# Patient Record
Sex: Female | Born: 1990 | Race: White | Hispanic: No | Marital: Married | Smoking: Never smoker
Health system: Southern US, Community
[De-identification: ages and names within clinical notes are randomized; demographics above are authoritative.]

## PROBLEM LIST (undated history)

## (undated) DIAGNOSIS — Z789 Other specified health status: Secondary | ICD-10-CM

## (undated) HISTORY — DX: Other specified health status: Z78.9

## (undated) HISTORY — PX: TUBAL LIGATION: SHX77

## (undated) HISTORY — PX: NASAL SEPTUM SURGERY: SHX37

---
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
## 2011-01-18 ENCOUNTER — Emergency Department: Payer: Self-pay | Admitting: Emergency Medicine

## 2011-01-21 ENCOUNTER — Other Ambulatory Visit: Payer: Self-pay | Admitting: Emergency Medicine

## 2011-02-01 ENCOUNTER — Emergency Department: Payer: Self-pay | Admitting: Emergency Medicine

## 2011-12-09 IMAGING — US US PELV - US TRANSVAGINAL
1 series · 17 of 25 positions shown · non-contrast
Comparison: none

REASON FOR EXAM: rule out products of conception, heavy vaginal bleeding
COMMENTS:

[Series 1: us pelv - us transvaginal · 17 of 72 slices shown]
[im 1/72]
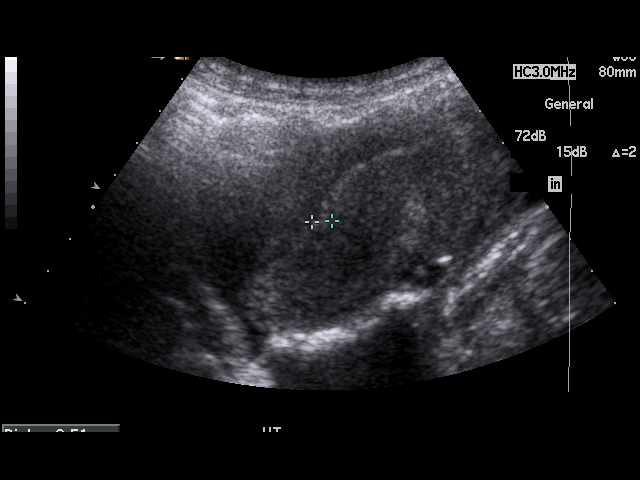
[im 6/72]
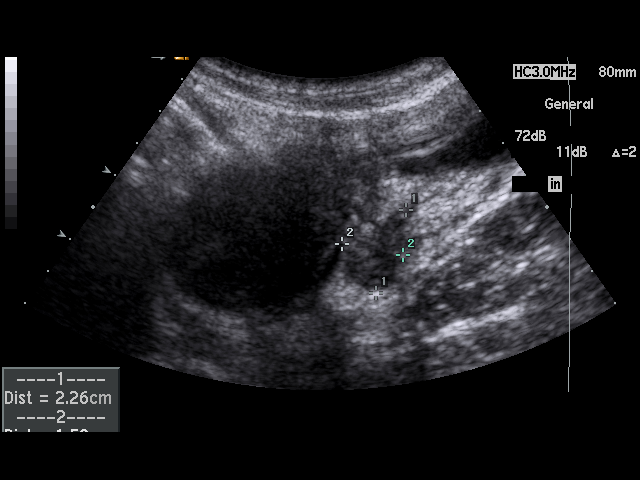
[im 9/72]
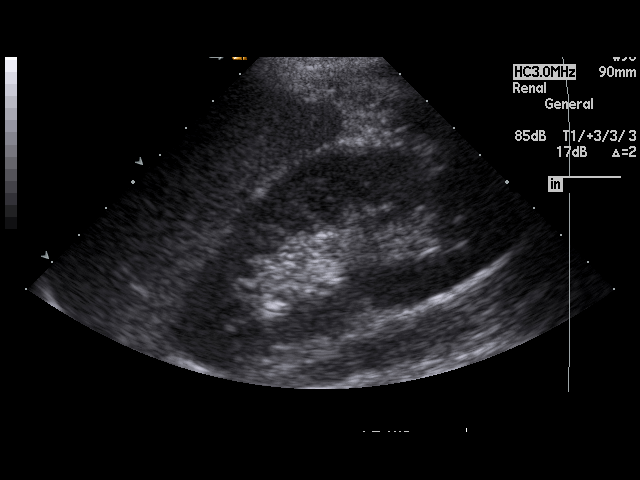
[im 15/72]
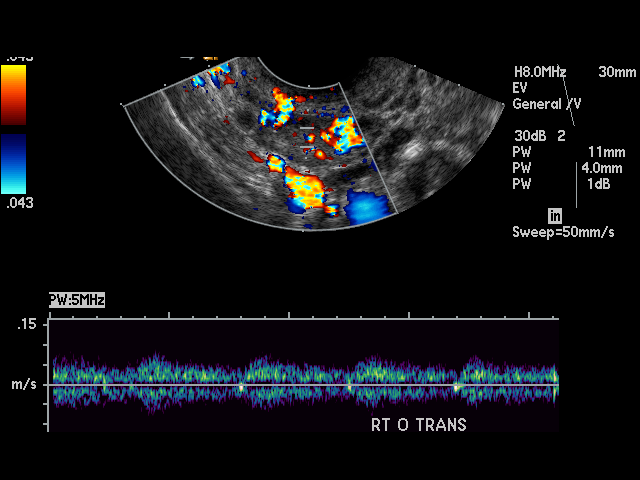
[im 18/72]
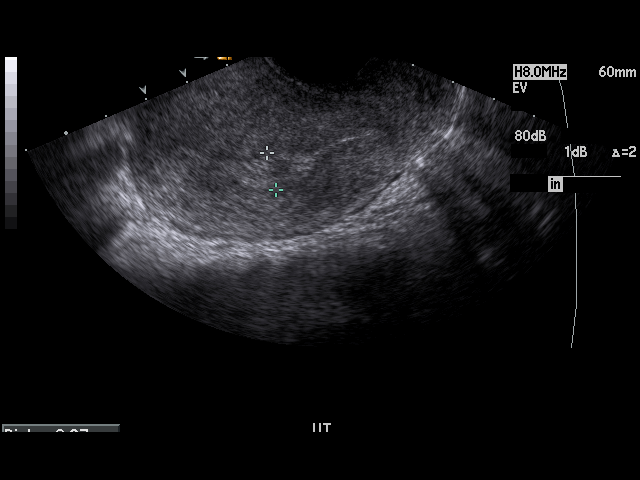
[im 24/72]
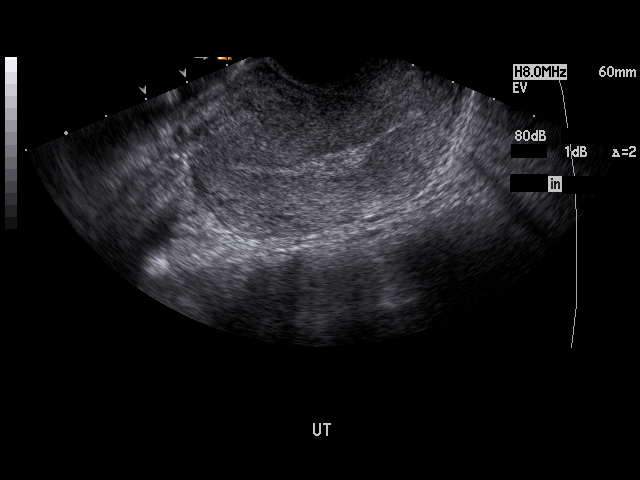
[im 27/72]
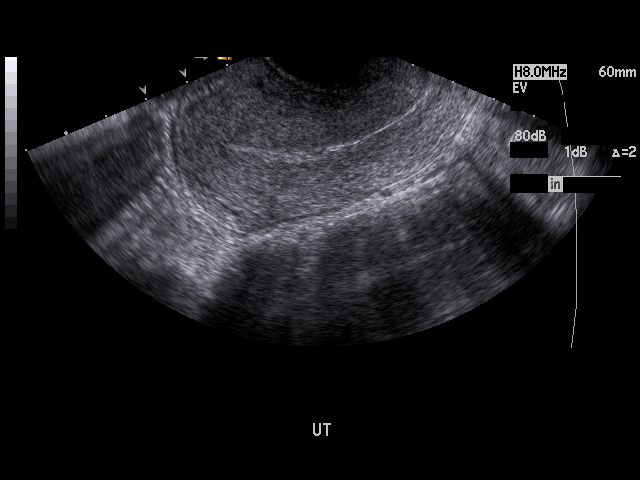
[im 33/72]
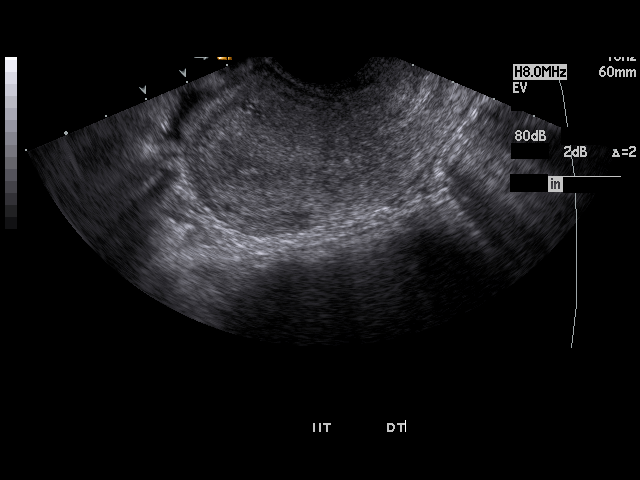
[im 36/72]
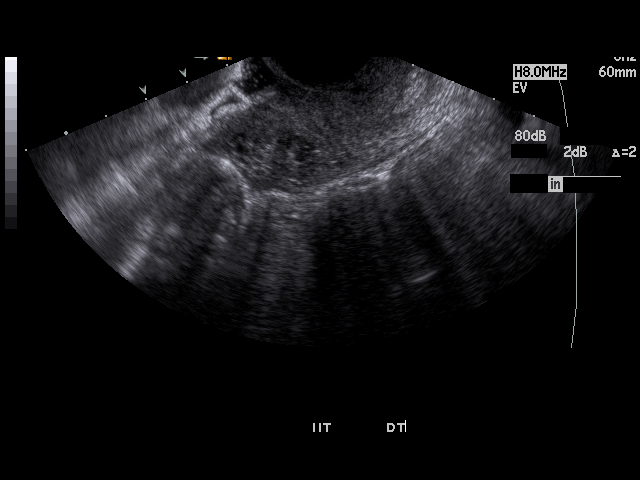
[im 39/72]
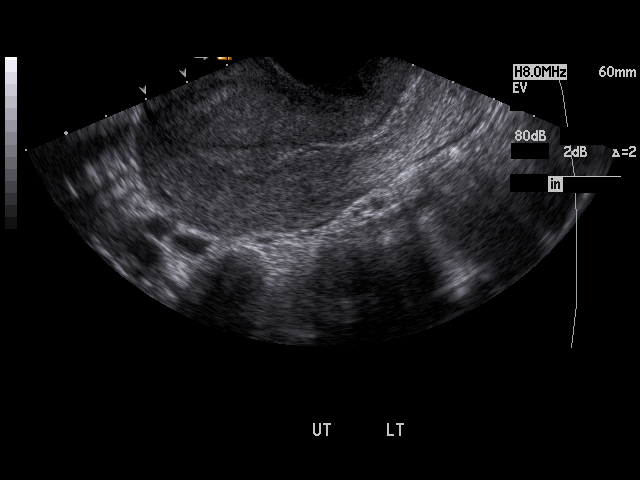
[im 45/72]
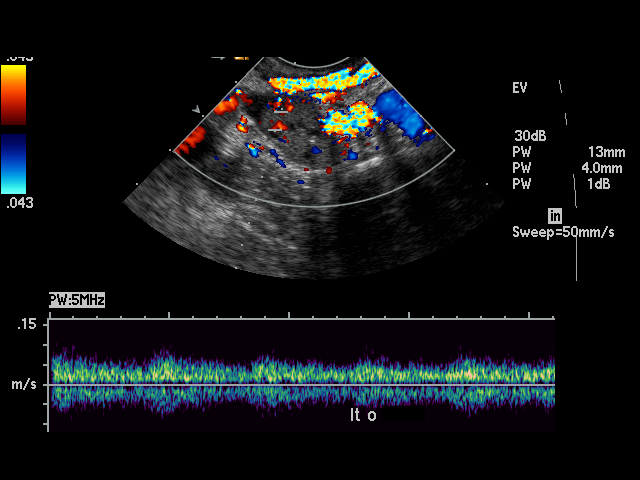
[im 48/72]
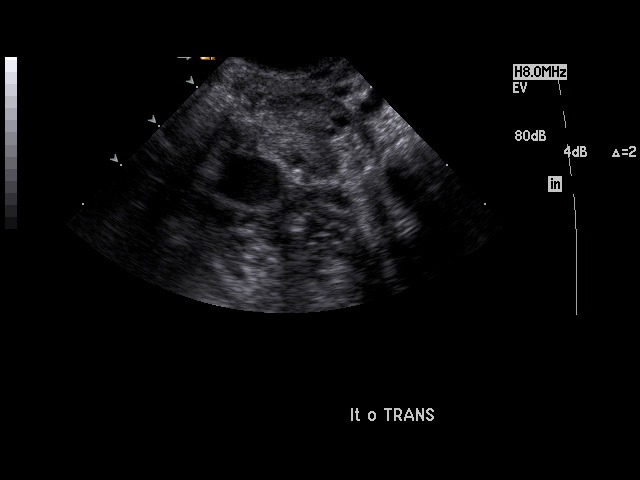
[im 54/72]
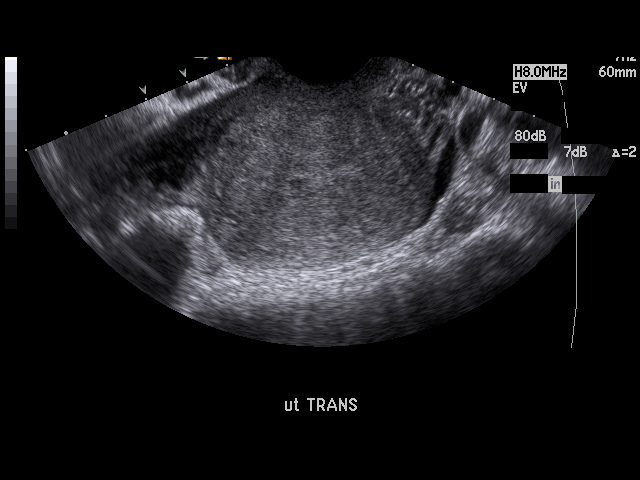
[im 57/72]
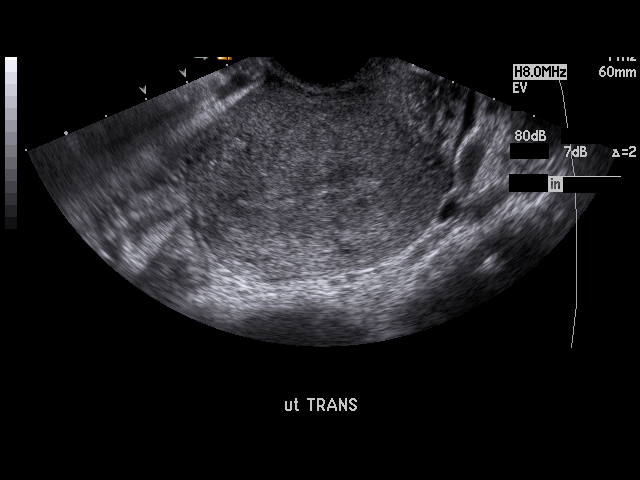
[im 63/72]
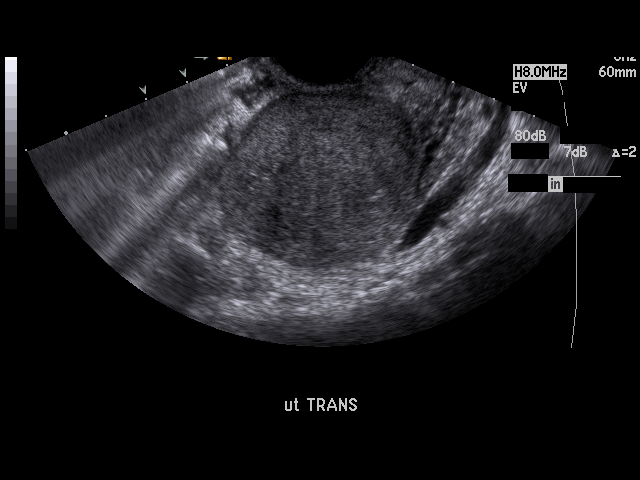
[im 66/72]
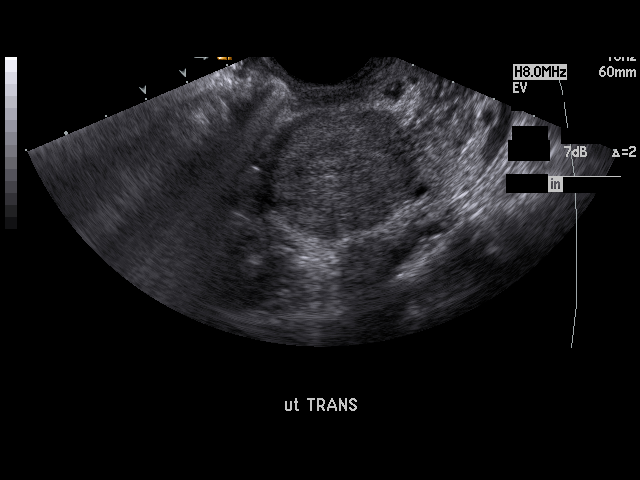
[im 72/72]
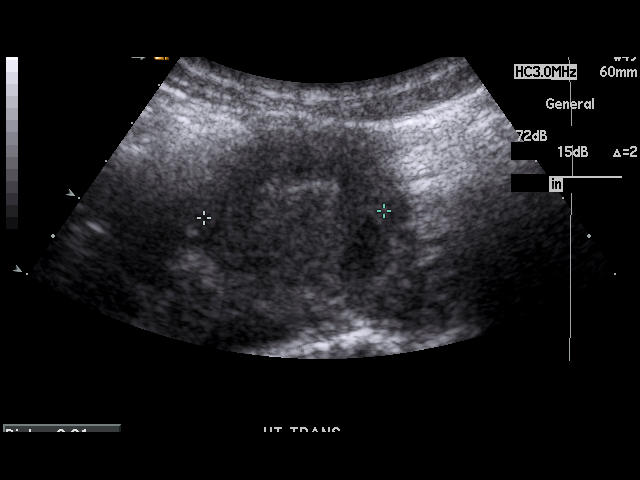

[17 of 25 positions shown; findings below may reference images not displayed]

PROCEDURE:     US  - US PELVIS EXAM W/TRANSVAGINAL  - February 01, 2011  [DATE]

RESULT:     The patient was actively bleeding during the course of the
study. The uterus measures 8.1 x 4.5 x 3.9 cm. The endometrial stripe is
thickened and there is increased echogenicity within the endometrial cavity
which was seen to decrease over the course of the study. The ovaries are
normal in echotexture and contour. No cystic or solid adnexal masses were
demonstrated. There is no free fluid in the cul-de-sac.
IMPRESSION: The patient is actively bleeding during the study, and it
was evident that clotted blood was being expelled from the endometrial
cavity. No discrete endometrial masses were identified. No adnexal masses
are demonstrated.

## 2012-08-17 ENCOUNTER — Emergency Department: Payer: Self-pay | Admitting: Emergency Medicine

## 2013-01-07 ENCOUNTER — Observation Stay: Payer: Self-pay | Admitting: Obstetrics and Gynecology

## 2013-01-07 LAB — URINALYSIS, COMPLETE
Bacteria: NONE SEEN
Bilirubin,UR: NEGATIVE
Blood: NEGATIVE
Glucose,UR: NEGATIVE mg/dL
Ketone: NEGATIVE
Leukocyte Esterase: NEGATIVE
Nitrite: NEGATIVE
Ph: 6
Protein: NEGATIVE
RBC,UR: 1 /HPF
Specific Gravity: 1.024
Squamous Epithelial: 1
WBC UR: 2 /HPF

## 2013-02-26 ENCOUNTER — Observation Stay: Payer: Self-pay | Admitting: Obstetrics & Gynecology

## 2013-05-12 ENCOUNTER — Observation Stay: Payer: Self-pay

## 2013-05-18 ENCOUNTER — Inpatient Hospital Stay: Payer: Self-pay | Admitting: Obstetrics and Gynecology

## 2013-05-18 LAB — CBC WITH DIFFERENTIAL/PLATELET
Eosinophil #: 0 10*3/uL (ref 0.0–0.7)
Eosinophil %: 0.5 %
HCT: 33.3 % — ABNORMAL LOW (ref 35.0–47.0)
Lymphocyte #: 1.5 10*3/uL (ref 1.0–3.6)
Lymphocyte %: 17.6 %
MCV: 76 fL — ABNORMAL LOW (ref 80–100)
Monocyte #: 0.5 x10 3/mm (ref 0.2–0.9)
Monocyte %: 6.6 %
Neutrophil #: 6.2 10*3/uL (ref 1.4–6.5)
Platelet: 224 10*3/uL (ref 150–440)
RBC: 4.36 10*6/uL (ref 3.80–5.20)
RDW: 15.3 % — ABNORMAL HIGH (ref 11.5–14.5)
WBC: 8.3 10*3/uL (ref 3.6–11.0)

## 2013-05-18 LAB — GC/CHLAMYDIA PROBE AMP

## 2013-10-08 ENCOUNTER — Emergency Department: Payer: Self-pay | Admitting: Emergency Medicine

## 2013-10-08 LAB — URINALYSIS, COMPLETE
Bilirubin,UR: NEGATIVE
Glucose,UR: NEGATIVE mg/dL (ref 0–75)
Nitrite: NEGATIVE
Ph: 5 (ref 4.5–8.0)
Protein: NEGATIVE
RBC,UR: 1 /HPF (ref 0–5)
Specific Gravity: 1.023 (ref 1.003–1.030)
WBC UR: 27 /HPF (ref 0–5)

## 2013-10-08 LAB — CBC
HCT: 44.7 % (ref 35.0–47.0)
MCH: 29.3 pg (ref 26.0–34.0)
MCHC: 33.7 g/dL (ref 32.0–36.0)
Platelet: 222 10*3/uL (ref 150–440)
RBC: 5.15 10*6/uL (ref 3.80–5.20)
RDW: 13.9 % (ref 11.5–14.5)

## 2013-10-08 LAB — COMPREHENSIVE METABOLIC PANEL
Anion Gap: 6 — ABNORMAL LOW (ref 7–16)
BUN: 9 mg/dL (ref 7–18)
Calcium, Total: 9.4 mg/dL (ref 8.5–10.1)
Chloride: 105 mmol/L (ref 98–107)
Co2: 24 mmol/L (ref 21–32)
Glucose: 95 mg/dL (ref 65–99)
SGOT(AST): 22 U/L (ref 15–37)
Sodium: 135 mmol/L — ABNORMAL LOW (ref 136–145)
Total Protein: 8 g/dL (ref 6.4–8.2)

## 2013-10-10 LAB — URINE CULTURE

## 2014-01-17 ENCOUNTER — Observation Stay: Payer: Self-pay | Admitting: Obstetrics and Gynecology

## 2014-02-09 ENCOUNTER — Observation Stay: Payer: Self-pay

## 2014-02-09 LAB — URINALYSIS, COMPLETE
Bilirubin,UR: NEGATIVE
Glucose,UR: NEGATIVE mg/dL (ref 0–75)
Nitrite: NEGATIVE
PROTEIN: NEGATIVE
Ph: 5 (ref 4.5–8.0)
SPECIFIC GRAVITY: 1.028 (ref 1.003–1.030)

## 2014-02-09 LAB — WET PREP, GENITAL

## 2014-03-24 ENCOUNTER — Observation Stay: Payer: Self-pay | Admitting: Obstetrics and Gynecology

## 2014-03-24 LAB — URINALYSIS, COMPLETE
Bilirubin,UR: NEGATIVE
Blood: NEGATIVE
GLUCOSE, UR: NEGATIVE mg/dL (ref 0–75)
KETONE: NEGATIVE
Nitrite: NEGATIVE
PH: 6 (ref 4.5–8.0)
Protein: NEGATIVE
RBC,UR: 3 /HPF (ref 0–5)
SPECIFIC GRAVITY: 1.013 (ref 1.003–1.030)
Squamous Epithelial: 3

## 2014-04-13 ENCOUNTER — Ambulatory Visit: Payer: Self-pay | Admitting: Obstetrics and Gynecology

## 2014-04-13 LAB — CBC WITH DIFFERENTIAL/PLATELET
BASOS PCT: 0.3 %
Basophil #: 0 10*3/uL (ref 0.0–0.1)
Eosinophil #: 0 10*3/uL (ref 0.0–0.7)
Eosinophil %: 0.4 %
HCT: 34.3 % — ABNORMAL LOW (ref 35.0–47.0)
HGB: 10.5 g/dL — ABNORMAL LOW (ref 12.0–16.0)
Lymphocyte #: 1.1 10*3/uL (ref 1.0–3.6)
Lymphocyte %: 14 %
MCH: 23.1 pg — ABNORMAL LOW (ref 26.0–34.0)
MCHC: 30.6 g/dL — ABNORMAL LOW (ref 32.0–36.0)
MCV: 76 fL — ABNORMAL LOW (ref 80–100)
Monocyte #: 0.5 x10 3/mm (ref 0.2–0.9)
Monocyte %: 6.8 %
Neutrophil #: 5.9 10*3/uL (ref 1.4–6.5)
Neutrophil %: 78.5 %
PLATELETS: 243 10*3/uL (ref 150–440)
RBC: 4.54 10*6/uL (ref 3.80–5.20)
RDW: 16.9 % — AB (ref 11.5–14.5)
WBC: 7.5 10*3/uL (ref 3.6–11.0)

## 2014-04-14 ENCOUNTER — Inpatient Hospital Stay: Payer: Self-pay

## 2014-04-14 LAB — GC/CHLAMYDIA PROBE AMP

## 2014-04-15 LAB — PATHOLOGY REPORT

## 2015-02-11 NOTE — Op Note (Signed)
PATIENT NAME:  Alicia JamesSTANFORD, Nuriyah N MR#:  [id number] DATE OF BIRTH:  Jul 31, 1991  DATE OF PROCEDURE:  04/14/2014  PREOPERATIVE DIAGNOSIS: Breech presentation.  POSTOPERATIVE DIAGNOSIS: Breech presentation, multiparous female desiring permanent sterilization.   PROCEDURE: Primary lower uterine transverse Cesarean section with postpartum bilateral tubal ligation.   SURGEON: Adria Devonarrie Annaliyah Willig, M.D.   ASSISTANT: Thomasene MohairStephen Jackson, MD  ESTIMATED BLOOD LOSS: 1000 mL.   FINDINGS: Term liveborn female infant, normal uterus, tubes and ovaries with complete transection of the tubes.   DESCRIPTION OF PROCEDURE: The patient was taken to the operating room and placed in supine position. After adequate general endotracheal anesthesia was instilled, the patient was prepped and draped in the usual sterile fashion. Timeout was performed. A Pfannenstiel skin incision was made approximately 2 fingerbreadths above the pubic symphysis and carried sharply down to the fascia. Fascia was nicked in the midline. The incision was extended in a superolateral manner. Muscle bellies were sharply and bluntly dissected off the rectus fascia. The muscle belly midline was identified and opened. The peritoneum was grasped, cut and opened. A bladder blade was placed. A bladder flap was created. Uterine incision was made. The infant's butt was delivered up to the feet. The knees were bent. The nuchal arms, first to the right then the left, were removed by rotating the infant and the infant's head was delivered. The infant's cord was clamped and cut and the infant was handed to the awaiting pediatrician. The uterus was exteriorized and wrapped in a moist laparotomy sponge. The interior of the uterus was curetted with a moist lap sponge. Uterine incision was closed with a running locked chromic and then a running imbricating chromic suture. Then 3-0 chromic was used to tack the bladder flap back up. The belly was cleared of clots. The gutters  were cleared. The uterus was placed back into the abdomen. The muscle bellies were approximated with a 0 Vicryl suture. The On-Q trocars were placed. The catheters were primed. The fascia was closed with a running Vicryl suture. The catheters were underneath the fascia but above the muscle. A 4-0 Monocryl was used to close the skin edges. Dermabond was placed on the On-Q catheters with a 4 x 4, and the uterine incision was closed with Steri-Strips and benzoin. Bandage was placed. Clear urine was noted in the Foley bag and the patient was taken to recovery after having tolerated the procedure well.  ____________________________ Elliot Gurneyarrie C. Krizia Flight, MD cck:sb D: 04/21/2014 01:10:10 ET T: 04/21/2014 10:53:08 ET JOB#: [id number]  cc: Elliot Gurneyarrie C. Katricia Prehn, MD, <Dictator> Elliot GurneyARRIE C Matson Welch MD ELECTRONICALLY SIGNED 04/26/2014 15:56

## 2015-02-28 NOTE — H&P (Signed)
L&D Evaluation:  History:  HPI 24 yo [id number] [facility] presenting with contractions and possible LOF on the way to work today.  Noted small amount of leakage at [employer], none since.  Also noted about five contraction in an hour, none since presentation.  +FM, no LOF, no VB.  History of oligohydramnios last pregnancy,  No history of PTB.   Presents with contractions, leaking fluid   Patient's Medical History No Chronic Illness   Patient's Surgical History none   Medications Pre Natal Vitamins   Allergies augmentin - throat swelling   Social History none   Family History Non-Contributory   ROS:  ROS All systems were reviewed.  HEENT, CNS, GI, GU, Respiratory, CV, Renal and Musculoskeletal systems were found to be normal.   Exam:  Vital Signs stable   Urine Protein not completed   General no apparent distress   Mental Status clear   Chest no increased work of breathing   Abdomen gravid, non-tender   Estimated Fetal Weight Average for gestational age   Pelvic no external lesions, visually closed   Mebranes Intact, negative ferning, nitrazine, and pooling   FHT 145, moderate, +accels, no decels reactive by <32 week criteria   Ucx absent   Impression:  Impression R/O ROM and R/O PTL   Plan:  Comments 1) R/O ROM  2) R/O PTL  3) Fetus - category I tracing  4) PNL - (O positive / ABSC neg / RI / VZI / HBsAg neg / HIV neg / RPR NR from prior pregnancy)  5) Disposition -discharge home has follow up for 28 week labs in 2 weeks   Electronic Signatures: Lorrene ReidStaebler, Karilyn Wind M (MD)  (Signed 30-Mar-15 17:10)  Authored: L&D Evaluation   Last Updated: 30-Mar-15 17:10 by Lorrene ReidStaebler, Maryln Eastham M (MD)

## 2015-02-28 NOTE — H&P (Signed)
L&D Evaluation:  History:  HPI 24 year old G3 P1011 with EDC=05/23/2013 by a 17wk4day ultrasound presents with c/o decreased FM. Felt baby move once today this AM. Last ate at 1430. No VB or LOF. Denied contractions. [facility] for a normal anatomy scan and low BMI (was 97# at start of pregnancy). Patient has gained 29# with pregnancy.   Presents with decreased fetal movement   Patient's Medical History No Chronic Illness   Patient's Surgical History none   Medications Pre Natal Vitamins   Allergies Augmentin   Social History none   Family History Non-Contributory   ROS:  ROS see HPI   Exam:  Vital Signs stable  98/64   Urine Protein not completed   General no apparent distress   Mental Status clear   Abdomen gravid, non-tender   Fetal Position vtx   Pelvic no external lesions, 1/60%/-1 to 0   Mebranes Intact, AFI=8.3cm with two pockets>3 cm   FHT 120 with multiple accels to 160s to 170s   FHT Description moderate variability   Ucx q3-7 min apart, mild   Skin dry   Impression:  Impression IUP at 38 3/7 weeks with reactive NST   Plan:  [facility]. Labor precautions.   Comments Advised to increase water intake.   Electronic Signatures: Trinna BalloonGutierrez, Elianis Fischbach L (CNM)  (Signed 23-Jul-14 23:59)  Authored: L&D Evaluation   Last Updated: 23-Jul-14 23:59 by Trinna BalloonGutierrez, Araseli Sherry L (CNM)

## 2015-02-28 NOTE — H&P (Signed)
L&D Evaluation:  History Expanded:  HPI 24 yo G4 P2012, EDD of 04/19/14 per 12 wk US, presents at 30w 1d with c/o gush of fluid last night and today. Denies regular ctx, VB or decreased FM. [facility] to [facility].   Blood Type (Maternal) O positive   Patient's Medical History No Chronic Illness   Patient's Surgical History none   Allergies Augmentin   Social History none   Exam:  Vital Signs stable   General no apparent distress   Mental Status clear   Abdomen gravid, non-tender   Edema no edema   Pelvic no external lesions, 1/50/high   Mebranes Intact   FHT normal rate with no decels, category 1 tracing - baseline 135, + accelerations, no decels   Ucx irregular ctx per toco, pt denies feeling them   Other Wet mount: + clue per lab (none seen on my exam) Pooling, Fern & nitrizine negative UA: tr ketones, 2+ leuk, 1+ blood, trace bacteria   Impression:  Impression IUP at 30 wks, intact membranes   Plan:  Comments Pt concerned about gushes she experienced as similar symptoms occured with last pregnancy prior to being diagnosed as Oligohydramnios. Given option to wait for US for AFI here (Ultrasound dept currently at high volume) or we can get US at our office - pt opted for office. Will set up in 1-2 days.   Recommended pelvic rest x 1 week d/t dilation despite not feeling contractions. Also advised to increase po hydration.   Electronic Signatures: Daemion Mcniel, Marta Lamasamara K (CNM)  (Signed 23-Apr-15 00:10)  Authored: L&D Evaluation   Last Updated: 23-Apr-15 00:10 by Vella KohlerBrothers, Jessicamarie Amiri K (CNM)

## 2015-02-28 NOTE — H&P (Signed)
L&D Evaluation:  History Expanded:  HPI 24 year old G3 P1011 with EDC=05/23/2013 by a 17wk4day ultrasound. Presents from office for IOL for AFI of 4.6 cm. EFW today was 19% (6#9oz). No VB or LOF. Denies painful contractions. [facility] normal anatomy scan and low BMI (was 97# at start of pregnancy).   Blood Type (Maternal) O positive   Group B Strep Results Maternal (Result >5wks must be treated as unknown) negative   Maternal HIV Negative   Maternal Syphilis Ab Nonreactive   Maternal Varicella Immune   Rubella Results (Maternal) immune   Maternal T-Dap Immune   Presents with Oligohydramnios   Patient's Medical History No Chronic Illness   Patient's Surgical History none   Medications Pre Natal Vitamins   Allergies PCN, Augmentin - anaphylaxis   Social History none   Family History Non-Contributory   ROS:  ROS see HPI   Exam:  Vital Signs stable   Urine Protein not completed   General no apparent distress   Mental Status clear   Chest clear   Heart no murmur/gallop/rubs   Abdomen gravid, non-tender   Estimated Fetal Weight EFW today 19%   Fetal Position vtx   Pelvic no external lesions, 1/75/0 to 1+   Mebranes Intact, fern, nitrizine & pooling negative   FHT normal rate with no decels, Category 1 - baseline 130, mod variability, +accelerations   Ucx irregular   Skin dry   Impression:  Impression IUP at 39 2/7 wks, Oligohydramnios   Plan:  Comments Discussed IOL with pt and s/o. Will plan for cervidil as cervix is unfavorable. Aware of risks including FITL, Oligo.   Electronic Signatures: Vella KohlerBrothers, Daylyn Azbill K (CNM)  (Signed 29-Jul-14 13:51)  Authored: L&D Evaluation   Last Updated: 29-Jul-14 13:51 by Vella KohlerBrothers, Marselino Slayton K (CNM)

## 2015-02-28 NOTE — H&P (Signed)
L&D Evaluation:  History Expanded:  HPI 24 yo G3P10011 at 5720 w 3 days who presnts for abdominal pain possible contractions. pt is very slight and she feels the movement of the baby, she is Opos and we have no other records even highlighted [facility]   [place]Gravida 3   Term 1   PreTerm 0   Abortion 1   Living 1   Blood Type (Maternal) O positive   Group B Strep Results Maternal (Result >5wks must be treated as unknown) unknown/result > 5 weeks ago   Maternal HIV Negative   Maternal Varicella Unknown   Rubella Results (Maternal) unknown   Maternal T-Dap Unknown   Presents with abdominal pain, contractions   Patient's Medical History No Chronic Illness   Patient's Surgical History none   Medications Pre Natal Vitamins   Allergies NKDA   Social History none  tobacco  second hand   Family History Non-Contributory   ROS:  ROS All systems were reviewed.  HEENT, CNS, GI, GU, Respiratory, CV, Renal and Musculoskeletal systems were found to be normal.   Exam:  Vital Signs stable   Urine Protein negative dipstick   General no apparent distress   Mental Status clear   Chest clear   Heart normal sinus rhythm   Abdomen gravid, non-tender   Estimated Fetal Weight Average for gestational age   Fundal Height umbilicus   Back no CVAT   Edema no edema   Reflexes 1+   Pelvic no external lesions   Mebranes Intact   FHT normal rate with no decels, flat tracing heart beat heard, 20 weeks only   Ucx absent   Skin dry   Impression:  Impression round ligament pain   Plan:  Plan UA, EFM/NST, monitor contractions and for cervical change   Comments cervix is VERY posterior thick long and firm and closed   Follow Up Appointment already scheduled   Electronic Signatures: Adria DevonKlett, Shaquanna Lycan (MD)  (Signed 20-Mar-14 20:38)  Authored: L&D Evaluation   Last Updated: 20-Mar-14 20:38 by Adria DevonKlett, Delayni Streed (MD)

## 2016-03-04 ENCOUNTER — Encounter: Payer: Self-pay | Admitting: Emergency Medicine

## 2016-03-04 ENCOUNTER — Emergency Department: Payer: Medicaid Other

## 2016-03-04 ENCOUNTER — Emergency Department
Admission: EM | Admit: 2016-03-04 | Discharge: 2016-03-04 | Disposition: A | Discharge status: Home / Self Care | Payer: Medicaid Other | Attending: Emergency Medicine | Admitting: Emergency Medicine

## 2016-03-04 DIAGNOSIS — G44309 Post-traumatic headache, unspecified, not intractable: Secondary | ICD-10-CM | POA: Diagnosis not present

## 2016-03-04 DIAGNOSIS — Y92410 Unspecified street and highway as the place of occurrence of the external cause: Secondary | ICD-10-CM | POA: Insufficient documentation

## 2016-03-04 DIAGNOSIS — R42 Dizziness and giddiness: Secondary | ICD-10-CM | POA: Diagnosis present

## 2016-03-04 DIAGNOSIS — Y939 Activity, unspecified: Secondary | ICD-10-CM | POA: Insufficient documentation

## 2016-03-04 DIAGNOSIS — Y999 Unspecified external cause status: Secondary | ICD-10-CM | POA: Diagnosis not present

## 2016-03-04 DIAGNOSIS — F0781 Postconcussional syndrome: Secondary | ICD-10-CM | POA: Diagnosis not present

## 2016-03-04 MED ORDER — BUTALBITAL-APAP-CAFFEINE 50-325-40 MG PO TABS: 1.0000 | ORAL_TABLET | Freq: Four times a day (QID) | ORAL | Status: AC | PRN
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
## 2016-03-04 NOTE — Discharge Instructions (Signed)
Post-Concussion Syndrome Post-concussion syndrome is the symptoms that can occur after a head injury. These symptoms can last from weeks to months. HOME CARE   Take medicines only as told by your doctor.  Do not take aspirin.  Sleep with your head raised to help with headaches.  Avoid activities that can cause another head injury.  Do not play contact sports like football, hockey, soccer, or basketball.  Do not do other risky activities like downhill skiing, martial arts, or horseback riding until your doctor says it is okay.  Keep all follow-up visits as told by your doctor. This is important.   GET HELP IF:   You have a harder time:  Paying attention.  Focusing.  Remembering.  Learning new information.  Dealing with stress.  You need more time to complete tasks.  You are easily bothered (irritable).  You have more symptoms.   Get help if you have any of these symptoms for more than two weeks after your injury:   Long-lasting (chronic) headaches.  Dizziness.  Trouble balancing.  Feeling sick to your stomach (nauseous).  Trouble with your vision.  Noise or light bothers you more.  Depression.  Mood swings.  Feeling worried (anxious).  Easily bothered.  Memory problems.  Trouble concentrating or paying attention.  Sleep problems.  Feeling tired all of the time.   GET HELP RIGHT AWAY IF:  You feel confused.  You feel very sleepy.  You are hard to wake up.  You feel sick to your stomach.  You keep throwing up (vomiting).  You feel like you are moving when you are not (vertigo).  Your eyes move back and forth very quickly.  You start shaking (convulsing) or pass out (faint).  You have very bad headaches that do not get better with medicine.  You cannot use your arms or legs like normal.  One of the black centers of your eyes (pupils) is bigger than the other.  You have clear or bloody fluid coming from your nose or ears.  Your  problems get worse, not better. MAKE SURE YOU:  Understand these instructions.  Will watch your condition.  Will get help right away if you are not doing well or get worse.   This information is not intended to replace advice given to you by your health care provider. Make sure you discuss any questions you have with your health care provider.   Document Released: 11/14/2004 Document Revised: 10/28/2014 Document Reviewed: 01/12/2014 Elsevier Interactive Patient Education 2016 [employer]  

## 2016-11-09 ENCOUNTER — Emergency Department
Admission: EM | Admit: 2016-11-09 | Discharge: 2016-11-09 | Disposition: A | Discharge status: Home / Self Care | Payer: Managed Care, Other (non HMO) | Attending: Emergency Medicine | Admitting: Emergency Medicine

## 2016-11-09 ENCOUNTER — Encounter: Payer: Self-pay | Admitting: Emergency Medicine

## 2016-11-09 DIAGNOSIS — Z79899 Other long term (current) drug therapy: Secondary | ICD-10-CM | POA: Diagnosis not present

## 2016-11-09 DIAGNOSIS — K0889 Other specified disorders of teeth and supporting structures: Secondary | ICD-10-CM | POA: Insufficient documentation

## 2016-11-09 MED ORDER — LIDOCAINE VISCOUS 2 % MT SOLN
15.0000 mL | Freq: Once | OROMUCOSAL | Status: AC
  Administered 2016-11-09: 15 mL via OROMUCOSAL
  Filled 2016-11-09: qty 15

## 2016-11-09 MED ORDER — CLINDAMYCIN HCL 300 MG PO CAPS: 300.0000 mg | ORAL_CAPSULE | Freq: Three times a day (TID) | ORAL | 0 refills | Status: DC

## 2016-11-09 MED ORDER — OXYCODONE-ACETAMINOPHEN 5-325 MG PO TABS: 1.0000 | ORAL_TABLET | ORAL | 0 refills | Status: DC | PRN

## 2016-11-09 MED ORDER — IBUPROFEN 600 MG PO TABS
600.0000 mg | ORAL_TABLET | Freq: Once | ORAL | Status: AC
  Administered 2016-11-09: 600 mg via ORAL
  Filled 2016-11-09: qty 1

## 2016-11-09 MED ORDER — CLINDAMYCIN HCL 150 MG PO CAPS
300.0000 mg | ORAL_CAPSULE | Freq: Once | ORAL | Status: AC
  Administered 2016-11-09: 300 mg via ORAL
  Filled 2016-11-09: qty 2

## 2016-11-09 MED ORDER — OXYCODONE-ACETAMINOPHEN 5-325 MG PO TABS
1.0000 | ORAL_TABLET | Freq: Once | ORAL | Status: AC
  Administered 2016-11-09: 1 via ORAL
  Filled 2016-11-09: qty 1

## 2016-11-09 MED ORDER — IBUPROFEN 600 MG PO TABS: 600.0000 mg | ORAL_TABLET | Freq: Three times a day (TID) | ORAL | 0 refills | Status: AC | PRN

## 2016-11-09 NOTE — ED Provider Notes (Signed)
[facility] Emergency Department Provider Note   ____________________________________________   First MD Initiated Contact with Patient 11/09/16 [phone]     (approximate)  I have reviewed the triage vital signs and the nursing notes.   HISTORY  Chief Complaint Dental Pain    HPI Alicia Jacobson is a 26 y.o. female presents to the ED from home with a chief complaint of dental pain. Patient reports right lower wisdom tooth pain for the past several months; worsening times one to 2 days. States she has a wisdom tooth coming in which seems to have broken her adjacent molar. Denies associated fever, chills, chest, shortness of breath, abdominal pain, nausea, vomiting, diarrhea. Denies recent travel trauma. Nothing makes her symptoms better or worse.   Past medical history None  There are no active problems to display for this patient.   Past Surgical History:  Procedure Laterality Date  . NASAL SEPTUM SURGERY    . TUBAL LIGATION      Prior to Admission medications   Medication Sig Start Date End Date Taking? Authorizing Provider  butalbital-acetaminophen-caffeine (FIORICET) 50-325-40 MG tablet Take 1-2 tablets by mouth every 6 (six) hours as needed for headache. 03/04/16 03/04/17  Chinita Pester, FNP  clindamycin (CLEOCIN) 300 MG capsule Take 1 capsule (300 mg total) by mouth 3 (three) times daily. 11/09/16   Irean Hong, MD  ibuprofen (ADVIL,MOTRIN) 600 MG tablet Take 1 tablet (600 mg total) by mouth every 8 (eight) hours as needed. 11/09/16   Irean Hong, MD  oxyCODONE-acetaminophen (ROXICET) 5-325 MG tablet Take 1 tablet by mouth every 4 (four) hours as needed for severe pain. 11/09/16   Irean Hong, MD    Allergies Amoxicillin and Augmentin [amoxicillin-pot clavulanate]  No family history on file.  Social History Social History  Substance Use Topics  . Smoking status: Never Smoker  . Smokeless tobacco: Never Used  . Alcohol use Yes     Review of Systems  Constitutional: No fever/chills. Eyes: No visual changes. ENT: Positive for dentalgia. No sore throat. Cardiovascular: Denies chest pain. Respiratory: Denies shortness of breath. Gastrointestinal: No abdominal pain.  No nausea, no vomiting.  No diarrhea.  No constipation. Genitourinary: Negative for dysuria. Musculoskeletal: Negative for back pain. Skin: Negative for rash. Neurological: Negative for headaches, focal weakness or numbness.  10-point ROS otherwise negative.  ____________________________________________   PHYSICAL EXAM:  VITAL SIGNS: ED Triage Vitals  Enc Vitals Group     BP 11/09/16 0013 114/60     Pulse Rate 11/09/16 0013 88     Resp 11/09/16 0013 16     Temp 11/09/16 0013 98.3 F (36.8 C)     Temp Source 11/09/16 0013 Oral     SpO2 11/09/16 0013 98 %     Weight 11/09/16 0015 100 lb (45.4 kg)     Height 11/09/16 0015 5' (1.524 m)     Head Circumference --      Peak Flow --      Pain Score 11/09/16 0015 10     Pain Loc --      Pain Edu? --      Excl. in GC? --     Constitutional: Alert and oriented. Well appearing and in no acute distress. Eyes: Conjunctivae are normal. PERRL. EOMI. Head: Atraumatic. Nose: No congestion/rhinnorhea. Mouth/Throat: Right lower wisdom tooth eruption. Adjacent molar with prior fracture. There is no intraoral or extraoral swelling. Mucous membranes are moist.  Oropharynx non-erythematous. Neck: No stridor.  Cardiovascular: Normal rate, regular rhythm. Grossly normal heart sounds.  Good peripheral circulation. Respiratory: Normal respiratory effort.  No retractions. Lungs CTAB. Gastrointestinal: Soft and nontender. No distention. No abdominal bruits. No CVA tenderness. Musculoskeletal: No lower extremity tenderness nor edema.  No joint effusions. Neurologic:  Normal speech and language. No gross focal neurologic deficits are appreciated. No gait instability. Skin:  Skin is warm, dry and intact. No  rash noted. Psychiatric: Mood and affect are normal. Speech and behavior are normal.  ____________________________________________   LABS (all labs ordered are listed, but only abnormal results are displayed)  Labs Reviewed - No data to display ____________________________________________  EKG  None ____________________________________________  RADIOLOGY  None ____________________________________________   PROCEDURES  Procedure(s) performed: None  Procedures  Critical Care performed: No  ____________________________________________   INITIAL IMPRESSION / ASSESSMENT AND PLAN / ED COURSE  Pertinent labs & imaging results that were available during my care of the patient were reviewed by me and considered in my medical decision making (see chart for details).  26 year old female who presents with dentalgia secondary to wisdom tooth eruption. Will place on clindamycin, analgesia and follow-up with dentistry. Strict return precautions given. Patient and spouse verbalize understanding and agree with plan of care.      ____________________________________________   FINAL CLINICAL IMPRESSION(S) / ED DIAGNOSES  Final diagnoses:  Pain, dental      NEW MEDICATIONS STARTED DURING THIS VISIT:  New Prescriptions   CLINDAMYCIN (CLEOCIN) 300 MG CAPSULE    Take 1 capsule (300 mg total) by mouth 3 (three) times daily.   IBUPROFEN (ADVIL,MOTRIN) 600 MG TABLET    Take 1 tablet (600 mg total) by mouth every 8 (eight) hours as needed.   OXYCODONE-ACETAMINOPHEN (ROXICET) 5-325 MG TABLET    Take 1 tablet by mouth every 4 (four) hours as needed for severe pain.     Note:  This document was prepared using Dragon voice recognition software and may include unintentional dictation errors.    Irean HongJade J Sung, MD 11/09/16 [phone]

## 2016-11-09 NOTE — ED Triage Notes (Signed)
Pt states that she has a wisdom tooth coming in which looks appears to have broken her right lower molar. Pt is ambulatory to triage with NAD noted at this time.

## 2016-11-09 NOTE — Discharge Instructions (Signed)
1. Take antibiotic as prescribed (Clindamycin 300mg  three times daily x 7 days). 2. Take pain medicines as needed (Motrin/Percocet #15). 3. Return to the ER for worsening symptoms, persistent vomiting, fever, difficulty breathing or other concerns.

## 2017-11-08 ENCOUNTER — Emergency Department: Payer: No Typology Code available for payment source

## 2017-11-08 ENCOUNTER — Emergency Department
Admission: EM | Admit: 2017-11-08 | Discharge: 2017-11-08 | Disposition: A | Discharge status: Home / Self Care | Payer: No Typology Code available for payment source | Attending: Emergency Medicine | Admitting: Emergency Medicine

## 2017-11-08 ENCOUNTER — Other Ambulatory Visit: Payer: Self-pay

## 2017-11-08 DIAGNOSIS — R51 Headache: Secondary | ICD-10-CM | POA: Diagnosis present

## 2017-11-08 DIAGNOSIS — M542 Cervicalgia: Secondary | ICD-10-CM | POA: Insufficient documentation

## 2017-11-08 LAB — POC URINE PREG, ED: Preg Test, Ur: NEGATIVE

## 2017-11-08 MED ORDER — CYCLOBENZAPRINE HCL 5 MG PO TABS: 5.0000 mg | ORAL_TABLET | Freq: Three times a day (TID) | ORAL | 0 refills | Status: AC | PRN

## 2017-11-08 MED ORDER — MELOXICAM 7.5 MG PO TABS: 7.5000 mg | ORAL_TABLET | Freq: Every day | ORAL | 1 refills | Status: AC

## 2017-11-08 NOTE — ED Notes (Signed)
No CT per [facility] [place]
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
## 2017-11-08 NOTE — ED Provider Notes (Signed)
  Physical Exam  BP 96/65 (BP Location: Right Arm)   Pulse 64   Temp 98.4 F (36.9 C) (Oral)   Resp 18   Ht 5\' 2"  (1.575 m)   Wt 43.1 kg (95 lb)   LMP 10/21/2017   SpO2 100%   BMI 17.38 kg/m   Physical Exam  ED Course/Procedures     Procedures  MDM   Patient was initially evaluated by Bridget Hartshornhonda Summers PA-C and I assumed care.  Differential diagnosis originally included C-spine fracture, intracranial hemorrhage and skull fracture.  CT head and CT cervical spine were reassuring and did not reveal concerning acute abnormalities.  Neurologic exam and overall physical exam are reassuring.  Patient was discharged with meloxicam and a short course of Flexeril and advised to follow-up with primary care as needed.  Vital signs are reassuring prior to discharge.  All patient questions were answered.    Pia MauWoods, Dineen Conradt La ComaM, PA-C 11/08/17 [id number]    Quale, Mark, MD 11/09/17 [phone]
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
## 2018-12-15 ENCOUNTER — Encounter: Payer: Self-pay | Admitting: Obstetrics and Gynecology

## 2018-12-16 ENCOUNTER — Encounter: Payer: Self-pay | Admitting: Obstetrics and Gynecology

## 2018-12-16 ENCOUNTER — Ambulatory Visit (INDEPENDENT_AMBULATORY_CARE_PROVIDER_SITE_OTHER): Payer: Medicaid Other | Admitting: Obstetrics and Gynecology

## 2018-12-16 VITALS — BP 108/66 | Wt 95.0 lb

## 2018-12-16 DIAGNOSIS — N926 Irregular menstruation, unspecified: Secondary | ICD-10-CM | POA: Diagnosis not present

## 2018-12-16 DIAGNOSIS — Z3202 Encounter for pregnancy test, result negative: Secondary | ICD-10-CM | POA: Diagnosis not present

## 2018-12-16 DIAGNOSIS — N912 Amenorrhea, unspecified: Secondary | ICD-10-CM

## 2018-12-16 LAB — POCT URINE PREGNANCY: PREG TEST UR: NEGATIVE

## 2018-12-16 MED ORDER — MEDROXYPROGESTERONE ACETATE 10 MG PO TABS: 10.0000 mg | ORAL_TABLET | Freq: Every day | ORAL | 0 refills | Status: AC

## 2018-12-16 NOTE — Progress Notes (Signed)
Obstetrics & Gynecology Office Visit   Chief Complaint  Patient presents with  . Amenorrhea    Negative test/bloating   History of Present Illness: 28 y.o. [id number] female whose last menstrual period was 10/30/2018 and is three weeks late.  She has taken at-home pregnancy test that have been negative.  Her last home urine pregnancy test was last week. Her pregnancy test was negative here today. She had a BTL during a c-section in 2015.  The pathology report indicates that the bilateral fallopian tubal segments showed a full cross-lumen of the tube.  For the past five years her menses have been regular, like clock-work.  She denies any life changes. Her weight has been consistent. She can't think of any single factor that has changed. Her last pap smear was multiple years ago.  Her last one was normal.    Past Medical History:  Diagnosis Date  . No known health problems     Past Surgical History:  Procedure Laterality Date  . CESAREAN SECTION W/BTL  2015   [facility]  . NASAL SEPTUM SURGERY    . TUBAL LIGATION      Gynecologic History: Patient's last menstrual period was 10/30/2018.  Obstetric History: [id number]  Family History: Patient denies history of gynecologic cancer.   Social History   Socioeconomic History  . Marital status: Married    Spouse name: Not on file  . Number of children: Not on file  . Years of education: Not on file  . Highest education level: Not on file  Occupational History  . Not on file  Social Needs  . Financial resource strain: Not on file  . Food insecurity:    Worry: Not on file    Inability: Not on file  . Transportation needs:    Medical: Not on file    Non-medical: Not on file  Tobacco Use  . Smoking status: Never Smoker  . Smokeless tobacco: Never Used  Substance and Sexual Activity  . Alcohol use: Yes  . Drug use: No  . Sexual activity: Yes    Birth control/protection: Surgical  Lifestyle  . Physical activity:    Days per week:  Not on file    Minutes per session: Not on file  . Stress: Not on file  Relationships  . Social connections:    Talks on phone: Not on file    Gets together: Not on file    Attends religious service: Not on file    Active member of club or organization: Not on file    Attends meetings of clubs or organizations: Not on file    Relationship status: Not on file  . Intimate partner violence:    Fear of current or ex partner: Not on file    Emotionally abused: Not on file    Physically abused: Not on file    Forced sexual activity: Not on file  Other Topics Concern  . Not on file  Social History Narrative  . Not on file    Allergies  Allergen Reactions  . Amoxicillin Anaphylaxis  . Augmentin [Amoxicillin-Pot Clavulanate] Anaphylaxis   Prior to Admission medications   Medication Sig Start Date End Date Taking? Authorizing Provider  ibuprofen (ADVIL,MOTRIN) 600 MG tablet Take 1 tablet (600 mg total) by mouth every 8 (eight) hours as needed. 11/09/16  Yes Irean Hong, MD    Review of Systems  Constitutional: Negative.   HENT: Negative.   Eyes: Negative.   Respiratory: Negative.   Cardiovascular: Negative.  Gastrointestinal: Negative.        Mild bloating  Genitourinary: Negative.   Musculoskeletal: Negative.   Skin: Negative.   Neurological: Negative.   Psychiatric/Behavioral: Negative.      Physical Exam BP 108/66 (BP Location: Left Arm, Patient Position: Sitting, Cuff Size: Normal)   Wt 95 lb (43.1 kg)   LMP 10/30/2018 Comment: BTL  BMI 17.38 kg/m  Patient's last menstrual period was 10/30/2018. Physical Exam Constitutional:      General: She is not in acute distress.    Appearance: Normal appearance. She is well-developed.  HENT:     Head: Normocephalic and atraumatic.  Eyes:     General: No scleral icterus.    Conjunctiva/sclera: Conjunctivae normal.  Neck:     Musculoskeletal: Normal range of motion and neck supple.  Cardiovascular:     Rate and Rhythm:  Normal rate and regular rhythm.     Heart sounds: No murmur. No friction rub. No gallop.   Pulmonary:     Effort: Pulmonary effort is normal. No respiratory distress.     Breath sounds: Normal breath sounds. No wheezing or rales.  Abdominal:     General: Bowel sounds are normal. There is no distension.     Palpations: Abdomen is soft. There is no mass.     Tenderness: There is no abdominal tenderness. There is no guarding or rebound.  Musculoskeletal: Normal range of motion.  Neurological:     General: No focal deficit present.     Mental Status: She is alert and oriented to person, place, and time.     Cranial Nerves: No cranial nerve deficit.  Skin:    General: Skin is warm and dry.     Findings: No erythema.  Psychiatric:        Mood and Affect: Mood normal.        Behavior: Behavior normal.        Judgment: Judgment normal.    Urine Pregnancy Test: negative  Female chaperone present for pelvic and breast  portions of the physical exam  Assessment: 28 y.o. [id number] female here for  1. Missed menses   2. Amenorrhea      Plan: Problem List Items Addressed This Visit    None    Visit Diagnoses    Missed menses    -  Primary   Relevant Medications   medroxyPROGESTERone (PROVERA) 10 MG tablet   Other Relevant Orders   Beta hCG quant (ref lab)   Amenorrhea       Relevant Orders   POCT urine pregnancy (Completed)     Will obtain a quantitative hCG today.  She was supplied with provera 10 mg x 10 days to affect a menses. We discussed that she should wait for the results of the blood work from today.  If negative, then she should take the medication and let me know if she has not had a period 10-14 days after she completes the medication and a further workup will be performed. If her period does return after the medication and remains abnormal, a further workup will need to be performed.  She was strongly encouraged to get a full annual with pelvic exam as soon as possible  as she is due for a pap smear at least. This may be part of her work up should her menses not return.   20 minutes spent in face to face discussion with > 50% spent in counseling,management, and coordination of care of her missed menses.  Thomasene Mohair, MD 12/16/2018 9:14 AM

## 2018-12-17 LAB — BETA HCG QUANT (REF LAB)

## 2019-12-10 DIAGNOSIS — K047 Periapical abscess without sinus: Secondary | ICD-10-CM | POA: Diagnosis not present

## 2020-02-02 ENCOUNTER — Emergency Department (HOSPITAL_COMMUNITY)
Admission: EM | Admit: 2020-02-02 | Discharge: 2020-02-02 | Disposition: A | Discharge status: Home / Self Care | Payer: Medicaid Other | Attending: Emergency Medicine | Admitting: Emergency Medicine

## 2020-02-02 ENCOUNTER — Other Ambulatory Visit: Payer: Self-pay

## 2020-02-02 ENCOUNTER — Ambulatory Visit: Payer: Self-pay | Admitting: *Deleted

## 2020-02-02 DIAGNOSIS — K625 Hemorrhage of anus and rectum: Secondary | ICD-10-CM | POA: Diagnosis not present

## 2020-02-02 LAB — CBC
HCT: 42.5 % (ref 36.0–46.0)
Hemoglobin: 14 g/dL (ref 12.0–15.0)
MCH: 31.6 pg (ref 26.0–34.0)
MCHC: 32.9 g/dL (ref 30.0–36.0)
MCV: 95.9 fL (ref 80.0–100.0)
Platelets: 172 10*3/uL (ref 150–400)
RBC: 4.43 MIL/uL (ref 3.87–5.11)
RDW: 12.6 % (ref 11.5–15.5)
WBC: 4.6 10*3/uL (ref 4.0–10.5)
nRBC: 0 % (ref 0.0–0.2)

## 2020-02-02 LAB — COMPREHENSIVE METABOLIC PANEL
ALT: 20 U/L (ref 0–44)
AST: 21 U/L (ref 15–41)
Albumin: 3.9 g/dL (ref 3.5–5.0)
Alkaline Phosphatase: 40 U/L (ref 38–126)
Anion gap: 7 (ref 5–15)
BUN: 8 mg/dL (ref 6–20)
CO2: 26 mmol/L (ref 22–32)
Calcium: 9.1 mg/dL (ref 8.9–10.3)
Chloride: 106 mmol/L (ref 98–111)
Creatinine, Ser: 0.8 mg/dL (ref 0.44–1.00)
GFR calc Af Amer: >60 mL/min (ref >60)
GFR calc non Af Amer: >60 mL/min (ref >60)
Glucose, Bld: 72 mg/dL (ref 70–99)
Potassium: 4.2 mmol/L (ref 3.5–5.1)
Sodium: 139 mmol/L (ref 135–145)
Total Bilirubin: 0.9 mg/dL (ref 0.3–1.2)
Total Protein: 6.4 g/dL — ABNORMAL LOW (ref 6.5–8.1)

## 2020-02-02 LAB — I-STAT BETA HCG BLOOD, ED (MC, WL, AP ONLY): I-stat hCG, quantitative: <5 m[IU]/mL (ref <5)

## 2020-02-02 LAB — POC OCCULT BLOOD, ED: Fecal Occult Bld: POSITIVE — AB

## 2020-02-02 NOTE — Discharge Instructions (Signed)
Per our discussion, you can take Tylenol and ibuprofen as needed for management of your pain.  You can also take these at the same time if you find that is necessary.  I believe your symptoms should gradually start improving in the next few days.  If you find that they worsen do not hesitate to return to the emergency department.  You are being given referral to gastroenterology.  Please feel free to reach out to them to discuss this visit as well as any gastrointestinal issues that you have in the future.  Also, please consider reaching out to Medicaid to set up a primary care provider.  It was a pleasure to meet you.

## 2020-02-02 NOTE — ED Triage Notes (Signed)
Pt reports blood on tissue when she wipes for the past week. Pt denies abd pain, no n/v.

## 2020-02-02 NOTE — Telephone Encounter (Signed)
She called in on the main hospital number for [facility] and was transferred to the [facility].  She called in concerned because she is having rectal bleeding for 4-5 days now.   At first she thought it was her period but her period is done and she is getting bright red blood on the toilet paper after every bowel movement.  She denies any of symptoms when asked.  See triage notes.  Since she does not have a primary doctor I have referred her to the ED.   "I live in [place] and [facility] is the closest place I know of since I don't have a doctor".  "That's the reason I called the main number to [facility]".  She then mentioned she thinks she could have a "tear back there" from sexual activity.    She was agreeable to going to [facility] ED now.  Reason for Disposition . [1] Rectal bleeding is minimal (e.g., blood just on toilet paper, few drops, streaks on surface of normal formed BM) AND [2] bleeding recurs 3 or more times on treatment  Answer Assessment - Initial Assessment Questions 1. APPEARANCE of BLOOD: "What color is it?" "Is it passed separately, on the surface of the stool, or mixed in with the stool?"      It started same time when my period ended.   This is going on for 4 days. I see it on the toilet paper. I don't have any symptoms. 2. AMOUNT: "How much blood was passed?"      Bright red on the toilet paper. 3. FREQUENCY: "How many times has blood been passed with the stools?"      4 days maybe 5 days. 4. ONSET: "When was the blood first seen in the stools?" (Days or weeks)      4-5 days ago 5. DIARRHEA: "Is there also some diarrhea?" If so, ask: "How many diarrhea stools were passed in past 24 hours?"      No 6. CONSTIPATION: "Do you have constipation?" If so, "How bad is it?"     No 7. RECURRENT SYMPTOMS: "Have you had blood in your stools before?" If so, ask: "When was the last time?" and "What happened that time?"      No.  I'm married and my husband likes "to do his  thing back there so I thought it might be a tear".  8. BLOOD THINNERS: "Do you take any blood thinners?" (e.g., Coumadin/warfarin, Pradaxa/dabigatran, aspirin)     Not asked 9. OTHER SYMPTOMS: "Do you have any other symptoms?"  (e.g., abdominal pain, vomiting, dizziness, fever)     No other symptoms. 10. PREGNANCY: "Is there any chance you are pregnant?" "When was your last menstrual period?"       Just finished her period 4 days ago.  Protocols used: RECTAL BLEEDING-A-AH

## 2020-02-02 NOTE — ED Provider Notes (Signed)
[facility] EMERGENCY DEPARTMENT Provider Note   CSN: [id number] Arrival date & time: 02/02/20  1012     History Chief Complaint  Patient presents with  . Rectal Bleeding    Alicia Jacobson is a 29 y.o. female.  HPI HPI Comments: Alicia Jacobson is a 29 y.o. female with no significant medical history who presents to the Emergency Department complaining of intermittent rectal bleeding for 3 to 4 days.  Patient states that she was experiencing her menstrual cycle last week which lasted for about 2 to 3 days and was normal.  Afterwards she noticed that when wiping status post defecation she was noticing bright red blood on the toilet paper.  She denies any significant amounts of bright red blood visualized in the toilet bowl.  No bloody stools.  She reports a history of receptive anal sex but denies she has had any recently for the last 2 or 3 weeks.  Last night she states she used an "anal plug".  No worsening of her rectal bleeding since this occurred.  She denies fevers, chills, abdominal pain, nausea, vomiting, diarrhea, chest pain, shortness of breath, urinary changes, hematuria, dysuria, syncope, lightheadedness, dizziness.     Past Medical History:  Diagnosis Date  . No known health problems     There are no problems to display for this patient.   Past Surgical History:  Procedure Laterality Date  . CESAREAN SECTION W/BTL  2015   [facility]  . NASAL SEPTUM SURGERY    . TUBAL LIGATION       OB History    Gravida  4   Para  3   Term  3   Preterm      AB  1   Living        SAB  1   TAB      Ectopic      Multiple      Live Births              No family history on file.  Social History   Tobacco Use  . Smoking status: Never Smoker  . Smokeless tobacco: Never Used  Substance Use Topics  . Alcohol use: Yes  . Drug use: No    Home Medications Prior to Admission medications   Medication Sig Start Date End Date Taking?  Authorizing Provider  ibuprofen (ADVIL,MOTRIN) 600 MG tablet Take 1 tablet (600 mg total) by mouth every 8 (eight) hours as needed. 11/09/16   Irean Hong, MD  medroxyPROGESTERone (PROVERA) 10 MG tablet Take 1 tablet (10 mg total) by mouth daily for 10 days. 12/16/18 12/26/18  Conard Novak, MD    Allergies    Amoxicillin and Augmentin [amoxicillin-pot clavulanate]  Review of Systems   Review of Systems  All other systems reviewed and are negative. Ten systems reviewed and are negative for acute change, except as noted in the HPI.   Physical Exam Updated Vital Signs BP 116/74 (BP Location: Right Arm)   Pulse 92   Temp 98.7 F (37.1 C) (Oral)   Resp 12   Ht 5' (1.524 m)   Wt 39.9 kg   LMP 01/26/2020   SpO2 100%   BMI 17.19 kg/m   Physical Exam Vitals and nursing note reviewed.  Constitutional:      General: She is not in acute distress.    Appearance: Normal appearance. She is normal weight. She is not ill-appearing, toxic-appearing or diaphoretic.  HENT:  Head: Normocephalic and atraumatic.     Right Ear: External ear normal.     Left Ear: External ear normal.     Nose: Nose normal.     Mouth/Throat:     Mouth: Mucous membranes are moist.     Pharynx: Oropharynx is clear. No oropharyngeal exudate or posterior oropharyngeal erythema.  Eyes:     General: No scleral icterus.       Right eye: No discharge.        Left eye: No discharge.     Extraocular Movements: Extraocular movements intact.     Conjunctiva/sclera: Conjunctivae normal.     Pupils: Pupils are equal, round, and reactive to light.  Cardiovascular:     Rate and Rhythm: Normal rate and regular rhythm.     Pulses: Normal pulses.     Heart sounds: Normal heart sounds. No murmur. No friction rub. No gallop.   Pulmonary:     Effort: Pulmonary effort is normal. No respiratory distress.     Breath sounds: No stridor. No wheezing, rhonchi or rales.  Abdominal:     General: Abdomen is flat.     Palpations:  Abdomen is soft.     Tenderness: There is no abdominal tenderness.  Genitourinary:    Comments: Female nursing chaperone present.  Rectal exam performed.  No signs of external hemorrhoids or bleeding.  No palpable internal hemorrhoids.  Patient has mild tenderness in the 9 o'clock region of the rectum.  No other signs of tenderness. Musculoskeletal:        General: Normal range of motion.     Cervical back: Normal range of motion and neck supple.  Skin:    General: Skin is warm and dry.     Capillary Refill: Capillary refill takes less than 2 seconds.  Neurological:     General: No focal deficit present.     Mental Status: She is alert and oriented to person, place, and time.  Psychiatric:        Mood and Affect: Mood normal.        Behavior: Behavior normal.    ED Results / Procedures / Treatments   Labs (all labs ordered are listed, but only abnormal results are displayed) Labs Reviewed  COMPREHENSIVE METABOLIC PANEL - Abnormal; Notable for the following components:      Result Value   Total Protein 6.4 (*)    All other components within normal limits  POC OCCULT BLOOD, ED - Abnormal; Notable for the following components:   Fecal Occult Bld POSITIVE (*)    All other components within normal limits  CBC  I-STAT BETA HCG BLOOD, ED ([facility], [facility], [facility] ONLY)  POC OCCULT BLOOD, ED   EKG None  Radiology No results found.  Procedures Procedures   Medications Ordered in ED Medications - No data to display  ED Course  I have reviewed the triage vital signs and the nursing notes.  Pertinent labs & imaging results that were available during my care of the patient were reviewed by me and considered in my medical decision making (see chart for details).    MDM Rules/Calculators/A&P                      11:43 AM patient is a pleasant 29 year old female with a history of receptive anal sex.  She notes 3 to 4-day history of rectal bleeding when wiping.  No hematochezia.  She notes a  history of similar symptoms secondary to anal trauma  but states they "typically do not last as long".  Vital signs are stable and her physical exam is reassuring.  Basic labs were obtained in triage.  Will reassess to based on labs and will perform Hemoccult test.  12:45 PM Hemoccult was positive.  Otherwise labs are reassuring.  Beta hCG is negative.  I discussed the results with the patient and her questions were answered.  She was given referral to gastroenterology.  She understands she can follow-up with them regarding these complaints in the future.  She knows to return to the emergency department with any new or worsening symptoms.  Her vital signs are stable.  She was amicable at the time of discharge.  Patient discharged to home/self care.  Condition at discharge: Stable  Note: Portions of this report may have been transcribed using voice recognition software. Every effort was made to ensure accuracy; however, inadvertent computerized transcription errors may be present.    Final Clinical Impression(s) / ED Diagnoses Final diagnoses:  Rectal bleeding    Rx / DC Orders ED Discharge Orders    None       Placido Sou, PA-C 02/02/20 1256    Gwyneth Sprout, MD 02/03/20 2132

## 2020-04-20 DIAGNOSIS — Z419 Encounter for procedure for purposes other than remedying health state, unspecified: Secondary | ICD-10-CM | POA: Diagnosis not present

## 2020-05-21 DIAGNOSIS — Z419 Encounter for procedure for purposes other than remedying health state, unspecified: Secondary | ICD-10-CM | POA: Diagnosis not present

## 2020-06-21 DIAGNOSIS — Z419 Encounter for procedure for purposes other than remedying health state, unspecified: Secondary | ICD-10-CM | POA: Diagnosis not present

## 2020-07-21 DIAGNOSIS — Z419 Encounter for procedure for purposes other than remedying health state, unspecified: Secondary | ICD-10-CM | POA: Diagnosis not present

## 2020-09-20 DIAGNOSIS — Z419 Encounter for procedure for purposes other than remedying health state, unspecified: Secondary | ICD-10-CM | POA: Diagnosis not present

## 2020-10-21 DIAGNOSIS — Z419 Encounter for procedure for purposes other than remedying health state, unspecified: Secondary | ICD-10-CM | POA: Diagnosis not present

## 2020-11-21 DIAGNOSIS — Z419 Encounter for procedure for purposes other than remedying health state, unspecified: Secondary | ICD-10-CM | POA: Diagnosis not present

## 2020-12-05 DIAGNOSIS — R5383 Other fatigue: Secondary | ICD-10-CM | POA: Diagnosis not present

## 2020-12-05 DIAGNOSIS — E0590 Thyrotoxicosis, unspecified without thyrotoxic crisis or storm: Secondary | ICD-10-CM | POA: Diagnosis not present

## 2020-12-05 DIAGNOSIS — R6882 Decreased libido: Secondary | ICD-10-CM | POA: Diagnosis not present

## 2021-01-11 DIAGNOSIS — E280 Estrogen excess: Secondary | ICD-10-CM | POA: Diagnosis not present

## 2021-01-11 DIAGNOSIS — R636 Underweight: Secondary | ICD-10-CM | POA: Diagnosis not present

## 2021-01-19 DIAGNOSIS — Z419 Encounter for procedure for purposes other than remedying health state, unspecified: Secondary | ICD-10-CM | POA: Diagnosis not present

## 2021-03-05 DIAGNOSIS — E280 Estrogen excess: Secondary | ICD-10-CM | POA: Diagnosis not present

## 2021-03-05 DIAGNOSIS — R9389 Abnormal findings on diagnostic imaging of other specified body structures: Secondary | ICD-10-CM | POA: Diagnosis not present

## 2021-11-26 ENCOUNTER — Encounter: Payer: Self-pay | Admitting: Dietician

## 2021-11-26 ENCOUNTER — Other Ambulatory Visit: Payer: Self-pay

## 2021-11-26 ENCOUNTER — Encounter: Payer: Medicaid Other | Attending: Family Medicine | Admitting: Dietician

## 2021-11-26 VITALS — Ht 60.0 in | Wt 105.5 lb

## 2021-11-26 DIAGNOSIS — R636 Underweight: Secondary | ICD-10-CM | POA: Insufficient documentation

## 2021-11-26 DIAGNOSIS — Z8659 Personal history of other mental and behavioral disorders: Secondary | ICD-10-CM

## 2021-11-26 DIAGNOSIS — E46 Unspecified protein-calorie malnutrition: Secondary | ICD-10-CM

## 2021-11-26 DIAGNOSIS — Z6820 Body mass index (BMI) 20.0-20.9, adult: Secondary | ICD-10-CM | POA: Insufficient documentation

## 2021-11-26 NOTE — Patient Instructions (Signed)
Plan to eat at least a few bites of an easy to eat food every 3 hours during the day. Avoid going longer than 4-5 hours without anything to eat. Keep foods low in fat and fiber to make digestion easier. It's OK to gradually increase fat and fiber in foods as stress around eating eases.  Have grab n go snacks on hand to take along when running errands. Cheese sticks and fruit, soft bread or cookies, dipping graham crackers or vanilla wafers in milk.  Try adding unflavored protein powder or powdered milk into foods to boost protein and healthy calories (like pudding, mashed potatoes, soup) Canned fruit with pudding or jello, canned or frozen veggies added to soup/ ramen broth can help improve vitamin and mineral intake and ability to eat more food.  A nutrition drink like carnation breakfast drink or a protein shake can help with nutrition and still be easy to digest.  Make meal and snack times relaxing and pleasant to lessen stress and anxiety around eating.
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
## 2021-12-19 ENCOUNTER — Encounter: Payer: Medicaid Other | Attending: Family Medicine | Admitting: Dietician

## 2021-12-19 ENCOUNTER — Other Ambulatory Visit: Payer: Self-pay

## 2021-12-19 ENCOUNTER — Encounter: Payer: Self-pay | Admitting: Dietician

## 2021-12-19 VITALS — Ht 60.0 in | Wt 104.0 lb

## 2021-12-19 DIAGNOSIS — Z8659 Personal history of other mental and behavioral disorders: Secondary | ICD-10-CM

## 2021-12-19 DIAGNOSIS — Z713 Dietary counseling and surveillance: Secondary | ICD-10-CM | POA: Diagnosis not present

## 2021-12-19 DIAGNOSIS — R636 Underweight: Secondary | ICD-10-CM | POA: Diagnosis not present

## 2021-12-19 DIAGNOSIS — E46 Unspecified protein-calorie malnutrition: Secondary | ICD-10-CM

## 2021-12-19 NOTE — Patient Instructions (Signed)
Great job increasing variety of foods and eating regularly, keep it up! ?Continue to eat a meal and snack every 3-4 hours during the day.  ?Include protein as often as possible to maintain muscle strength. ?

## 2021-12-19 NOTE — Progress Notes (Signed)
Medical Nutrition Therapy: Visit start time: 1445  end time: 1520  ?Assessment:  Diagnosis: underweight ?Medical history changes: no changes ?Psychosocial issues/ stress concerns: history of eating disorder; voices concern related to recent weight loss ? ?Current weight: 104.0lbs Height: 5'0" BMI: 20.31 ?Medications, supplement changes: no changes per patient ? ?Progress and evaluation:  ?Patient reports feeling a bit more comfortable with food, feeling ill less often, is able to eat more variety and sometimes larger portions. She feels the issue with nausea was likely anxiety around food.  ?She is focusing on foods she likes, and not trying foods she feels she cannot eat. Is now able to eat some chicken nuggets.  ?Unable to eat arder textured foods due to dental issues.  ?  ?Physical activity: ADLs including farm chores ? ?Dietary Intake:  ?Usual eating pattern includes 3 meals and 1-2 snacks per day. ?Dining out frequency: 2-3 meals per week. ? ?Breakfast: 1/4c - 1/2c corned beef hash, bread (avoiding sweet things in am) ?Snack: sometimes more corned beef hash; banana/ strawberries ?Lunch: noodles, mashed potatoes + bread; sandwich with PBJ; pizza today ?Snack: honey bun; star crunch cookie; boost drink ?Supper: soup or stew ?Snack: none ?Beverages: juice, milk, water, koolaid ? ?Nutrition Care Education: ?Topics covered:  ?Basic nutrition: basic food groups, appropriate nutrient balance, appropriate meal and snack schedule, general nutrition guidelines    ?Weight gain: reviewed progress since previous visit, importance of adequate protein in maintaining strength and adequate calories, discussed adding supplemental protein, or dry milk powder, into foods as appropriate; discussed eating high kcal foods first when not feeling hungry ? ? ?Nutritional Diagnosis:  [place]-3.1 Underweight As related to poor appetite, postprandial nausea.  As evidenced by patient reported symptoms, recent weight loss with current BMI of  20.3. ?NI-5.11.1 Predicted suboptimal nutrient intake As related to small meals related to food anxiety.  As evidenced by patient's dietary recall. ? ?Intervention:  ?Instruction and discussion as noted above. ?Encouraged patient to take multivitamin daily to help with maintaining normal nutrient stores. ?Updated nutrition goals with input from patient. ?She will plan to return for weight check in 1 month (01/21/22); will schedule additional MNT visit later if needed. ? ?Insurance underwriter given:  ?Visit summary with goals/ instructions to be viewed via MyChart ? ? ?Learner/ who was taught:  ?Patient  ? ?Level of understanding: ?Verbalizes/ demonstrates competency ? ? ?Demonstrated degree of understanding via:   Teach back ?Learning barriers: ?None ? ?Willingness to learn/ readiness for change: ?Eager, change in progress ? ? ?Monitoring and Evaluation:  Dietary intake, exercise, and body weight ?     follow up:  for weight check on 01/21/22 at 1pm   ?

## 2022-01-02 ENCOUNTER — Ambulatory Visit: Payer: Medicaid Other | Admitting: Dietician

## 2022-01-21 ENCOUNTER — Encounter: Payer: Self-pay | Admitting: Dietician

## 2022-01-21 NOTE — Progress Notes (Signed)
Patient came for weight check. Weight measured at 97.8lbs (BMI 19.2) which is a loss of 6.2lbs since visit on 12/19/21.  ?She reports recently feeling more GI distress after eating, and has often been unable to eat. She reports no hunger symptoms and often forgets to eat, but is also averse to eating due to postprandial nausea.  ?She wants to restart Megestrol; states she has been denied prescription refill. Advised her to consult with MD to discuss further in light of recent weight loss.  ?Discussed with patient that continued weight loss could result in need for enteral nutrition support and possibly parenteral nutrition. Advised use of high calorie liquid nutritional supplements when unable to tolerate solid foods well.  ?

## 2022-02-18 ENCOUNTER — Encounter: Payer: Self-pay | Admitting: Dietician

## 2022-02-18 NOTE — Progress Notes (Signed)
Alicia Jacobson came to [facility] for a weight check; measured at 100.2lbs today at about 3:00pm. This is an increase of 2.4lbs since previous check on 01/21/21. She will plan to return about 03/25/22 for another weight check. ?

## 2022-04-01 ENCOUNTER — Encounter: Payer: Self-pay | Admitting: Dietician

## 2022-04-01 NOTE — Progress Notes (Unsigned)
Patient came for weight check on 03/28/22; weight was 100.2lbs, unchanged from previous measurement on 02/18/22.

## 2022-07-06 ENCOUNTER — Emergency Department
Admission: EM | Admit: 2022-07-06 | Discharge: 2022-07-06 | Disposition: A | Discharge status: Home / Self Care | Payer: Medicaid Other | Attending: Emergency Medicine | Admitting: Emergency Medicine

## 2022-07-06 ENCOUNTER — Other Ambulatory Visit: Payer: Self-pay

## 2022-07-06 DIAGNOSIS — R112 Nausea with vomiting, unspecified: Secondary | ICD-10-CM | POA: Insufficient documentation

## 2022-07-06 DIAGNOSIS — E860 Dehydration: Secondary | ICD-10-CM | POA: Diagnosis not present

## 2022-07-06 DIAGNOSIS — R197 Diarrhea, unspecified: Secondary | ICD-10-CM | POA: Insufficient documentation

## 2022-07-06 DIAGNOSIS — Z20822 Contact with and (suspected) exposure to covid-19: Secondary | ICD-10-CM | POA: Insufficient documentation

## 2022-07-06 LAB — CBC WITH DIFFERENTIAL/PLATELET
Abs Immature Granulocytes: 0.01 10*3/uL (ref 0.00–0.07)
Basophils Absolute: 0 10*3/uL (ref 0.0–0.1)
Basophils Relative: 0 %
Eosinophils Absolute: 0 10*3/uL (ref 0.0–0.5)
Eosinophils Relative: 1 %
HCT: 42.7 % (ref 36.0–46.0)
Hemoglobin: 14.6 g/dL (ref 12.0–15.0)
Immature Granulocytes: 0 %
Lymphocytes Relative: 26 %
Lymphs Abs: 1.6 10*3/uL (ref 0.7–4.0)
MCH: 30.9 pg (ref 26.0–34.0)
MCHC: 34.2 g/dL (ref 30.0–36.0)
MCV: 90.5 fL (ref 80.0–100.0)
Monocytes Absolute: 0.4 10*3/uL (ref 0.1–1.0)
Monocytes Relative: 6 %
Neutro Abs: 4.1 10*3/uL (ref 1.7–7.7)
Neutrophils Relative %: 67 %
Platelets: 220 10*3/uL (ref 150–400)
RBC: 4.72 MIL/uL (ref 3.87–5.11)
RDW: 12.3 % (ref 11.5–15.5)
WBC: 6.2 10*3/uL (ref 4.0–10.5)
nRBC: 0 % (ref 0.0–0.2)

## 2022-07-06 LAB — COMPREHENSIVE METABOLIC PANEL
ALT: 16 U/L (ref 0–44)
AST: 25 U/L (ref 15–41)
Albumin: 4.8 g/dL (ref 3.5–5.0)
Alkaline Phosphatase: 48 U/L (ref 38–126)
Anion gap: 8 (ref 5–15)
BUN: 11 mg/dL (ref 6–20)
CO2: 23 mmol/L (ref 22–32)
Calcium: 9.5 mg/dL (ref 8.9–10.3)
Chloride: 110 mmol/L (ref 98–111)
Creatinine, Ser: 1.01 mg/dL — ABNORMAL HIGH (ref 0.44–1.00)
GFR, Estimated: >60 mL/min (ref >60)
Glucose, Bld: 107 mg/dL — ABNORMAL HIGH (ref 70–99)
Potassium: 3.5 mmol/L (ref 3.5–5.1)
Sodium: 141 mmol/L (ref 135–145)
Total Bilirubin: 1 mg/dL (ref 0.3–1.2)
Total Protein: 7.9 g/dL (ref 6.5–8.1)

## 2022-07-06 LAB — URINALYSIS, ROUTINE W REFLEX MICROSCOPIC
Bacteria, UA: NONE SEEN
Bilirubin Urine: NEGATIVE
Glucose, UA: NEGATIVE mg/dL
Ketones, ur: 20 mg/dL — AB
Leukocytes,Ua: NEGATIVE
Nitrite: NEGATIVE
Protein, ur: NEGATIVE mg/dL
Specific Gravity, Urine: 1.005 (ref 1.005–1.030)
pH: 7 (ref 5.0–8.0)

## 2022-07-06 LAB — RESP PANEL BY RT-PCR (FLU A&B, COVID) ARPGX2
Influenza A by PCR: NEGATIVE
Influenza B by PCR: NEGATIVE
SARS Coronavirus 2 by RT PCR: NEGATIVE

## 2022-07-06 MED ORDER — ONDANSETRON 4 MG PO TBDP: 4.0000 mg | ORAL_TABLET | Freq: Three times a day (TID) | ORAL | 0 refills | Status: AC | PRN

## 2022-07-06 MED ORDER — SODIUM CHLORIDE 0.9 % IV BOLUS
1000.0000 mL | Freq: Once | INTRAVENOUS | Status: AC
  Administered 2022-07-06: 1000 mL via INTRAVENOUS

## 2022-07-06 MED ORDER — ONDANSETRON HCL 4 MG/2ML IJ SOLN
4.0000 mg | Freq: Once | INTRAMUSCULAR | Status: AC
  Administered 2022-07-06: 4 mg via INTRAVENOUS
  Filled 2022-07-06: qty 2

## 2022-07-06 NOTE — ED Triage Notes (Signed)
Arrived via EMS with c/o N/V/D x days.  Reports that she hasnt been able to keep anything down.  Noone in family have these s/s.

## 2022-07-06 NOTE — Discharge Instructions (Addendum)
You have been seen today in the emergency room for nausea/vomiting/diarrhea.  I will be prescribing you a medication called Zofran to take for your nausea.  Please be sure to stay adequately hydrated over the next several days.  If your symptoms persist or worsen follow-up at the urgent care.

## 2022-07-06 NOTE — ED Provider Notes (Signed)
[facility] Emergency Department Provider Note   ____________________________________________   Event Date/Time   First MD Initiated Contact with Patient 07/06/22 2017     (approximate)  I have reviewed the triage vital signs and the nursing notes.   HISTORY  Chief Complaint Emesis (N/v/d x2 days)    HPI Alicia Jacobson is a 31 y.o. female presents to the emergency room for complaint of nausea/vomiting/diarrhea for the past 24 hours. Patient reports that yesterday she started with nausea/vomiting/diarrhea and was unable to keep any fluids or solids down.  She reports that she vomited approximately 6 times and had approximately 4 episodes of loose stools.  She reports that she drank some electrolyte fluids to rehydrate and was feeling some better.  She reports that today she was feeling better and went to an event in [place].  Midway through that event she began to feel very nauseous and the nausea/vomiting/diarrhea started again. Since mid afternoon she has been unable to hold any fluids or solids down. She reports that she is having minimal lower abdominal cramping. She denies any other symptoms of illness to include URI symptoms.  She denies any fevers.  She denies any urinary symptoms. She denies any known sick exposures. She reports that she has taken over-the-counter Imodium with no relief of symptoms.     Past Medical History:  Diagnosis Date   No known health problems     There are no problems to display for this patient.   Past Surgical History:  Procedure Laterality Date   CESAREAN SECTION W/BTL  2015   [facility]   NASAL SEPTUM SURGERY     TUBAL LIGATION      Prior to Admission medications   Medication Sig Start Date End Date Taking? Authorizing Provider  ondansetron (ZOFRAN-ODT) 4 MG disintegrating tablet Take 1 tablet (4 mg total) by mouth every 8 (eight) hours as needed for nausea or vomiting. 07/06/22  Yes Herschell Dimes, NP   citalopram (CELEXA) 10 MG tablet Take 10 mg by mouth daily. 09/03/21   [provider]  ibuprofen (ADVIL,MOTRIN) 600 MG tablet Take 1 tablet (600 mg total) by mouth every 8 (eight) hours as needed. Patient not taking: Reported on 02/02/2020 11/09/16   Irean Hong, MD  medroxyPROGESTERone (PROVERA) 10 MG tablet Take 1 tablet (10 mg total) by mouth daily for 10 days. Patient not taking: Reported on 02/02/2020 12/16/18 12/26/18  Conard Novak, MD  Multiple Vitamin (MULTIVITAMIN WITH MINERALS) TABS tablet Take 1 tablet by mouth daily.    [provider]    Allergies Amoxicillin and Augmentin [amoxicillin-pot clavulanate]  No family history on file.  Social History Social History   Tobacco Use   Smoking status: Never   Smokeless tobacco: Never  Vaping Use   Vaping Use: Never used  Substance Use Topics   Alcohol use: Yes    Alcohol/week: 1.0 standard drink of alcohol    Types: 1 Standard drinks or equivalent per week   Drug use: No    Review of Systems  Constitutional: No fever/chills Eyes: No visual changes. ENT: No sore throat. Cardiovascular: Denies chest pain. Respiratory: Denies shortness of breath. Gastrointestinal: Positive for lower abdominal cramping.  Positive for nausea/vomiting/diarrhea Genitourinary: Negative for dysuria. Musculoskeletal: Negative for back pain. Skin: Negative for rash. Neurological: Negative for headaches, focal weakness or numbness.   ____________________________________________   PHYSICAL EXAM:  VITAL SIGNS: ED Triage Vitals  Enc Vitals Group     BP  Pulse      Resp      Temp      Temp src      SpO2      Weight      Height      Head Circumference      Peak Flow      Pain Score      Pain Loc      Pain Edu?      Excl. in [place]?     Constitutional: Alert and oriented. Well appearing and in no acute distress. Eyes: Conjunctivae are normal. PERRL. EOMI. Head: Atraumatic. Nose: No  congestion/rhinnorhea. Mouth/Throat: Mucous membranes are moist.  Oropharynx non-erythematous. Neck: No stridor.   Cardiovascular: Normal rate, regular rhythm. Grossly normal heart sounds.  Good peripheral circulation. Respiratory: Normal respiratory effort.  No retractions. Lungs CTAB. Gastrointestinal: Soft and nontender. No distention. No abdominal bruits. No CVA tenderness.  There is no rebound tenderness or guarding. Musculoskeletal: No lower extremity tenderness nor edema.  No joint effusions. Neurologic:  Normal speech and language. No gross focal neurologic deficits are appreciated. No gait instability. Skin:  Skin is warm, dry and intact. No rash noted. Psychiatric: Mood and affect are normal. Speech and behavior are normal.  ____________________________________________   LABS (all labs ordered are listed, but only abnormal results are displayed)  Labs Reviewed  COMPREHENSIVE METABOLIC PANEL - Abnormal; Notable for the following components:      Result Value   Glucose, Bld 107 (*)    Creatinine, Ser 1.01 (*)    All other components within normal limits  URINALYSIS, ROUTINE W REFLEX MICROSCOPIC - Abnormal; Notable for the following components:   Color, Urine YELLOW (*)    APPearance CLEAR (*)    Hgb urine dipstick SMALL (*)    Ketones, ur 20 (*)    All other components within normal limits  RESP PANEL BY RT-PCR (FLU A&B, COVID) ARPGX2  CBC WITH DIFFERENTIAL/PLATELET   ____________________________________________  EKG   ____________________________________________  RADIOLOGY  ED MD interpretation:    Official radiology report(s): No results found.  ____________________________________________   PROCEDURES  Procedure(s) performed: None  Procedures  Critical Care performed: No  ____________________________________________   INITIAL IMPRESSION / ASSESSMENT AND PLAN / ED COURSE     Alicia Jacobson is a 31 y.o. female presents to the emergency room  for complaint of nausea/vomiting/diarrhea for the past 24 hours. Patient reports that yesterday she started with nausea/vomiting/diarrhea and was unable to keep any fluids or solids down.  She reports that she vomited approximately 6 times and had approximately 4 episodes of loose stools.  She reports that she drank some electrolyte fluids to rehydrate and was feeling some better.  She reports that today she was feeling better and went to an event in [place].  Midway through that event she began to feel very nauseous and the nausea/vomiting/diarrhea started again. Since mid afternoon she has been unable to hold any fluids or solids down. She reports that she is having minimal lower abdominal cramping. She denies any other symptoms of illness to include URI symptoms.  She denies any fevers.  She denies any urinary symptoms. She denies any known sick exposures. She reports that she has taken over-the-counter Imodium with no relief of symptoms.  I will order CBC, CMP, urinalysis, respiratory panel for COVID and flu. I will also order fluid bolus of 1000 mL. We will give Zofran for nausea.  CBC and CMP are both reassuring.  Urinalysis shows only small  amount of blood and trace ketones. Minimal dehydration, if any, noted. Patient is negative for COVID and flu.  Will discharge patient home in stable condition with prescription of Zofran for nausea.      ____________________________________________   FINAL CLINICAL IMPRESSION(S) / ED DIAGNOSES  Final diagnoses:  Nausea vomiting and diarrhea  Dehydration     ED Discharge Orders          Ordered    ondansetron (ZOFRAN-ODT) 4 MG disintegrating tablet  Every 8 hours PRN        07/06/22 2206             Note:  This document was prepared using Dragon voice recognition software and may include unintentional dictation errors.     Herschell Dimes, NP 07/06/22 2207    Chesley Noon, MD 07/06/22 2329

## 2022-07-08 ENCOUNTER — Emergency Department: Payer: Medicaid Other

## 2022-07-08 ENCOUNTER — Emergency Department
Admission: EM | Admit: 2022-07-08 | Discharge: 2022-07-08 | Disposition: A | Discharge status: Home / Self Care | Payer: Medicaid Other | Attending: Emergency Medicine | Admitting: Emergency Medicine

## 2022-07-08 ENCOUNTER — Other Ambulatory Visit: Payer: Self-pay

## 2022-07-08 DIAGNOSIS — R197 Diarrhea, unspecified: Secondary | ICD-10-CM | POA: Diagnosis not present

## 2022-07-08 DIAGNOSIS — R824 Acetonuria: Secondary | ICD-10-CM | POA: Diagnosis not present

## 2022-07-08 DIAGNOSIS — R1084 Generalized abdominal pain: Secondary | ICD-10-CM | POA: Diagnosis present

## 2022-07-08 DIAGNOSIS — R112 Nausea with vomiting, unspecified: Secondary | ICD-10-CM | POA: Insufficient documentation

## 2022-07-08 DIAGNOSIS — R109 Unspecified abdominal pain: Secondary | ICD-10-CM

## 2022-07-08 DIAGNOSIS — K529 Noninfective gastroenteritis and colitis, unspecified: Secondary | ICD-10-CM

## 2022-07-08 LAB — COMPREHENSIVE METABOLIC PANEL
ALT: 16 U/L (ref 0–44)
AST: 21 U/L (ref 15–41)
Albumin: 4.5 g/dL (ref 3.5–5.0)
Alkaline Phosphatase: 45 U/L (ref 38–126)
Anion gap: 11 (ref 5–15)
BUN: 13 mg/dL (ref 6–20)
CO2: 19 mmol/L — ABNORMAL LOW (ref 22–32)
Calcium: 9.3 mg/dL (ref 8.9–10.3)
Chloride: 111 mmol/L (ref 98–111)
Creatinine, Ser: 1.01 mg/dL — ABNORMAL HIGH (ref 0.44–1.00)
GFR, Estimated: >60 mL/min (ref >60)
Glucose, Bld: 84 mg/dL (ref 70–99)
Potassium: 3.9 mmol/L (ref 3.5–5.1)
Sodium: 141 mmol/L (ref 135–145)
Total Bilirubin: 1.2 mg/dL (ref 0.3–1.2)
Total Protein: 7.3 g/dL (ref 6.5–8.1)

## 2022-07-08 LAB — URINALYSIS, ROUTINE W REFLEX MICROSCOPIC
Bacteria, UA: NONE SEEN
Bilirubin Urine: NEGATIVE
Glucose, UA: NEGATIVE mg/dL
Ketones, ur: 80 mg/dL — AB
Nitrite: NEGATIVE
Protein, ur: NEGATIVE mg/dL
Specific Gravity, Urine: >1.046 — ABNORMAL HIGH (ref 1.005–1.030)
pH: 5 (ref 5.0–8.0)

## 2022-07-08 LAB — CBC
HCT: 43.5 % (ref 36.0–46.0)
Hemoglobin: 14.6 g/dL (ref 12.0–15.0)
MCH: 31.1 pg (ref 26.0–34.0)
MCHC: 33.6 g/dL (ref 30.0–36.0)
MCV: 92.6 fL (ref 80.0–100.0)
Platelets: 189 10*3/uL (ref 150–400)
RBC: 4.7 MIL/uL (ref 3.87–5.11)
RDW: 12.3 % (ref 11.5–15.5)
WBC: 5.2 10*3/uL (ref 4.0–10.5)
nRBC: 0 % (ref 0.0–0.2)

## 2022-07-08 LAB — POC URINE PREG, ED: Preg Test, Ur: NEGATIVE

## 2022-07-08 LAB — LIPASE, BLOOD: Lipase: 24 U/L (ref 11–51)

## 2022-07-08 MED ORDER — DICYCLOMINE HCL 10 MG PO CAPS
10.0000 mg | ORAL_CAPSULE | Freq: Once | ORAL | Status: AC
  Administered 2022-07-08: 10 mg via ORAL
  Filled 2022-07-08: qty 1

## 2022-07-08 MED ORDER — ONDANSETRON HCL 4 MG/2ML IJ SOLN
4.0000 mg | Freq: Once | INTRAMUSCULAR | Status: AC
  Administered 2022-07-08: 4 mg via INTRAVENOUS
  Filled 2022-07-08: qty 2

## 2022-07-08 MED ORDER — DICYCLOMINE HCL 10 MG PO CAPS: 10.0000 mg | ORAL_CAPSULE | Freq: Three times a day (TID) | ORAL | 0 refills | Status: AC | PRN

## 2022-07-08 MED ORDER — IOHEXOL 300 MG/ML  SOLN
100.0000 mL | Freq: Once | INTRAMUSCULAR | Status: AC | PRN
  Administered 2022-07-08: 100 mL via INTRAVENOUS

## 2022-07-08 MED ORDER — KETOROLAC TROMETHAMINE 30 MG/ML IJ SOLN
30.0000 mg | Freq: Once | INTRAMUSCULAR | Status: AC
  Administered 2022-07-08: 30 mg via INTRAVENOUS
  Filled 2022-07-08: qty 1

## 2022-07-08 MED ORDER — LORAZEPAM 2 MG/ML IJ SOLN
0.5000 mg | Freq: Once | INTRAMUSCULAR | Status: AC
  Administered 2022-07-08: 0.5 mg via INTRAVENOUS
  Filled 2022-07-08: qty 1

## 2022-07-08 MED ORDER — SODIUM CHLORIDE 0.9 % IV BOLUS
1000.0000 mL | Freq: Once | INTRAVENOUS | Status: AC
  Administered 2022-07-08: 1000 mL via INTRAVENOUS

## 2022-07-08 NOTE — ED Triage Notes (Signed)
Pt to ED via POV from home. Pt reports was seen 2 days ago for lower abdominal pain and N/V/D. Pt states she was sent home and symptoms have continued to get worse. Pt hunched over in wheelchair.

## 2022-07-08 NOTE — ED Provider Notes (Signed)
[facility] Provider Note    Event Date/Time   First MD Initiated Contact with Patient 07/08/22 [phone]     (approximate)  History   Chief Complaint: Abdominal Pain  HPI  Alicia Jacobson is a 31 y.o. female with no significant past medical history who presents to the emergency department for nausea vomiting diarrhea and abdominal pain.  According to the patient for the past 4 days she has had nausea vomiting diarrhea as well as intermittent and diffuse abdominal pain.  Denies any fever.  Denies any focal area of abdominal pain.  Patient was seen in the emergency department 2 days ago for the same said everything was well-appearing and the patient was discharged home.  Patient states since going home she has continued to have nausea and this morning she began vomiting once again so she came to the emergency department.  Patient denies any fever at any point.  No dysuria.  No vaginal bleeding, no discharge.  Physical Exam   Triage Vital Signs: ED Triage Vitals [07/08/22 0752]  Enc Vitals Group     BP 108/78     Pulse Rate (!) 120     Resp 20     Temp 98.2 F (36.8 C)     Temp Source Oral     SpO2 98 %     Weight 100 lb (45.4 kg)     Height 5' (1.524 m)     Head Circumference      Peak Flow      Pain Score 10     Pain Loc      Pain Edu?      Excl. in [place]?     Most recent vital signs: Vitals:   07/08/22 0752  BP: 108/78  Pulse: (!) 120  Resp: 20  Temp: 98.2 F (36.8 C)  SpO2: 98%    General: Awake, no distress.  CV:  Good peripheral perfusion.  Regular rate and rhythm  Resp:  Normal effort.  Equal breath sounds bilaterally.  Abd:  No distention.  Soft, mild left lower quadrant tenderness.  No rebound or guarding.    ED Results / Procedures / Treatments   RADIOLOGY  I reviewed and interpreted the CT images I do not see any obvious obstruction on my evaluation. Radiology is read the CT scan is essentially negative.   MEDICATIONS  ORDERED IN ED: Medications  sodium chloride 0.9 % bolus 1,000 mL (has no administration in time range)  ondansetron (ZOFRAN) injection 4 mg (has no administration in time range)  ketorolac (TORADOL) 30 MG/ML injection 30 mg (has no administration in time range)  LORazepam (ATIVAN) injection 0.5 mg (has no administration in time range)     IMPRESSION / MDM / ASSESSMENT AND PLAN / ED COURSE  I reviewed the triage vital signs and the nursing notes.  Patient's presentation is most consistent with acute presentation with potential threat to life or bodily function.  Patient presents emergency department for nausea vomiting diarrhea as well as left lower quadrant abdominal discomfort ongoing x4 days.  I reviewed the patient's work-up from 2 days ago, lab work is reassuring.  However as the patient continues to have abdominal pain we will obtain CT imaging.  Patient request nonnarcotic medication we will dose Toradol, Zofran, IV fluids.  Patient is quite anxious during my examination and states she thinks she could have been having a panic attack this morning, we will dose small dose of Ativan and continue to closely  monitor.  Patient agreeable to plan of care.  CT shows no acute abnormality.  Urinalysis does show ketones patient has received IV fluids and is feeling better.  Pregnancy test is negative.  We will prescribe Bentyl for the patient's intestinal type pain patient has Zofran at home.  I discussed with the patient using Zofran twice a day if needed for nausea and Bentyl 3 times a day.  Discussed the importance of trying to hydrate as well as return precautions if the patient is unable to hydrate over 24 hours.  Patient agreeable to plan.  Still suspect likely gastroenteritis.  FINAL CLINICAL IMPRESSION(S) / ED DIAGNOSES   Nausea vomiting diarrhea Abdominal pain  Rx / DC Orders   Bentyl  Note:  This document was prepared using Dragon voice recognition software and may include  unintentional dictation errors.   Minna Antis, MD 07/08/22 1213

## 2022-07-08 NOTE — ED Notes (Signed)
See triage note  Presents with abd pain and some n/v/d  States sxs' started a few days ago  Was seen for same 2 days ago but thinks sx's are getting worse  Afebrile on arrival

## 2024-07-20 ENCOUNTER — Emergency Department
Admission: EM | Admit: 2024-07-20 | Discharge: 2024-07-21 | Disposition: A | Discharge status: Home / Self Care | Attending: Emergency Medicine | Admitting: Emergency Medicine

## 2024-07-20 ENCOUNTER — Encounter: Payer: Self-pay | Admitting: Emergency Medicine

## 2024-07-20 ENCOUNTER — Emergency Department

## 2024-07-20 ENCOUNTER — Other Ambulatory Visit: Payer: Self-pay

## 2024-07-20 DIAGNOSIS — N390 Urinary tract infection, site not specified: Secondary | ICD-10-CM | POA: Diagnosis not present

## 2024-07-20 DIAGNOSIS — F4321 Adjustment disorder with depressed mood: Secondary | ICD-10-CM | POA: Diagnosis not present

## 2024-07-20 DIAGNOSIS — R45851 Suicidal ideations: Secondary | ICD-10-CM | POA: Diagnosis not present

## 2024-07-20 DIAGNOSIS — R1030 Lower abdominal pain, unspecified: Secondary | ICD-10-CM | POA: Diagnosis present

## 2024-07-20 DIAGNOSIS — F329 Major depressive disorder, single episode, unspecified: Secondary | ICD-10-CM | POA: Diagnosis not present

## 2024-07-20 DIAGNOSIS — R770 Abnormality of albumin: Secondary | ICD-10-CM | POA: Insufficient documentation

## 2024-07-20 DIAGNOSIS — E8809 Other disorders of plasma-protein metabolism, not elsewhere classified: Secondary | ICD-10-CM

## 2024-07-20 LAB — CBC
HCT: 41.1 % (ref 36.0–46.0)
Hemoglobin: 13.1 g/dL (ref 12.0–15.0)
MCH: 28.7 pg (ref 26.0–34.0)
MCHC: 31.9 g/dL (ref 30.0–36.0)
MCV: 89.9 fL (ref 80.0–100.0)
Platelets: 262 K/uL (ref 150–400)
RBC: 4.57 MIL/uL (ref 3.87–5.11)
RDW: 13.8 % (ref 11.5–15.5)
WBC: 5.6 K/uL (ref 4.0–10.5)
nRBC: 0 % (ref 0.0–0.2)

## 2024-07-20 LAB — URINALYSIS, ROUTINE W REFLEX MICROSCOPIC
Bilirubin Urine: NEGATIVE
Glucose, UA: NEGATIVE mg/dL
Ketones, ur: NEGATIVE mg/dL
Leukocytes,Ua: NEGATIVE
Nitrite: POSITIVE — AB
Protein, ur: >=300 mg/dL — AB
Specific Gravity, Urine: 1.035 — ABNORMAL HIGH (ref 1.005–1.030)
pH: 5 (ref 5.0–8.0)

## 2024-07-20 LAB — COMPREHENSIVE METABOLIC PANEL WITH GFR
ALT: 14 U/L (ref 0–44)
AST: 26 U/L (ref 15–41)
Albumin: 1.5 g/dL — ABNORMAL LOW (ref 3.5–5.0)
Alkaline Phosphatase: 70 U/L (ref 38–126)
Anion gap: 5 (ref 5–15)
BUN: 21 mg/dL — ABNORMAL HIGH (ref 6–20)
CO2: 23 mmol/L (ref 22–32)
Calcium: 7.4 mg/dL — ABNORMAL LOW (ref 8.9–10.3)
Chloride: 106 mmol/L (ref 98–111)
Creatinine, Ser: 1 mg/dL (ref 0.44–1.00)
GFR, Estimated: >60 mL/min (ref >60)
Glucose, Bld: 107 mg/dL — ABNORMAL HIGH (ref 70–99)
Potassium: 3.9 mmol/L (ref 3.5–5.1)
Sodium: 134 mmol/L — ABNORMAL LOW (ref 135–145)
Total Bilirubin: 0.3 mg/dL (ref 0.0–1.2)
Total Protein: 5.2 g/dL — ABNORMAL LOW (ref 6.5–8.1)

## 2024-07-20 LAB — TYPE AND SCREEN
ABO/RH(D): O POS
Antibody Screen: NEGATIVE

## 2024-07-20 LAB — LIPASE, BLOOD: Lipase: 31 U/L (ref 11–51)

## 2024-07-20 LAB — PREGNANCY, URINE: Preg Test, Ur: NEGATIVE

## 2024-07-20 LAB — TSH: TSH: 4.513 u[IU]/mL — ABNORMAL HIGH (ref 0.350–4.500)

## 2024-07-20 MED ORDER — SULFAMETHOXAZOLE-TRIMETHOPRIM 200-40 MG/5ML PO SUSP: 20.0000 mL | Freq: Two times a day (BID) | ORAL | 0 refills | Status: AC

## 2024-07-20 MED ORDER — SODIUM CHLORIDE 0.9 % IV BOLUS
1000.0000 mL | Freq: Once | INTRAVENOUS | Status: AC
  Administered 2024-07-20: 1000 mL via INTRAVENOUS

## 2024-07-20 MED ORDER — SULFAMETHOXAZOLE-TRIMETHOPRIM 800-160 MG PO TABS
1.0000 | ORAL_TABLET | Freq: Once | ORAL | Status: AC
  Administered 2024-07-21: 1 via ORAL
  Filled 2024-07-20: qty 1

## 2024-07-20 MED ORDER — CLINDAMYCIN PALMITATE HCL 75 MG/5ML PO SOLR: 300.0000 mg | Freq: Three times a day (TID) | ORAL | 0 refills | Status: DC

## 2024-07-20 MED ORDER — ACETAMINOPHEN 325 MG PO TABS
650.0000 mg | ORAL_TABLET | Freq: Once | ORAL | Status: AC
  Administered 2024-07-20: 650 mg via ORAL
  Filled 2024-07-20: qty 2

## 2024-07-20 MED ORDER — IOHEXOL 300 MG/ML  SOLN
75.0000 mL | Freq: Once | INTRAMUSCULAR | Status: AC | PRN
  Administered 2024-07-20: 75 mL via INTRAVENOUS

## 2024-07-20 MED ORDER — HYDROCODONE-ACETAMINOPHEN 7.5-325 MG/15ML PO SOLN: 10.0000 mL | Freq: Four times a day (QID) | ORAL | 0 refills | Status: AC | PRN

## 2024-07-20 NOTE — ED Triage Notes (Signed)
 Patient to ED via POV for lower abd pain with N/V/D. States ongoing x1 month. States blood in stool- bright red, started recently. Denies urinary symptoms. PT stating her depression has worsening recently and having SI thoughts but does not have plan to kill herself.

## 2024-07-20 NOTE — BH Assessment (Signed)
 Patient was deferred to [facility] for a telepsych assessment. The assigned care coordinator will provide updates regarding the scheduling of the assessment. [facility]IS coordinator can be reached at [phone] for further information on the timing of the telepsych evaluation.

## 2024-07-20 NOTE — ED Notes (Signed)
 Pt walked to interview room with TTS @ this time

## 2024-07-20 NOTE — ED Notes (Signed)
 Pt given snack and drink at this time. Pt calm and cooperative.
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
## 2024-07-20 NOTE — ED Notes (Addendum)
 PT's husband to take patient's belongings home.   Patient's husband noted to be dressing patient and answering many questions for her.

## 2024-07-20 NOTE — ED Notes (Signed)
 Patient requesting that Eva (husband) be called with updates. This RN told patient that once we have results back, then he would be called at [phone].

## 2024-07-20 NOTE — Discharge Instructions (Addendum)
 Take the antibiotic as prescribed and finish the full 7-day course.  Use Tylenol  as needed for pain.  You may take the hydrocodone acetaminophen  instead for more severe breakthrough pain over the next couple of days.  For the low albumin/protein level, focus on a well-balanced diet rich in complete proteins, such as lean meats, fish, eggs, and dairy products, along with whole grains and healthy fats. Supplement with vitamins and minerals, especially vitamin D, zinc, and B vitamins, to support protein metabolism and overall health.  Return to the ER immediately for new, worsening, or persistent symptoms including abdominal pain, nausea or vomiting, inability to hold anything down, decreased appetite, weakness, dizziness, recurrent episodes of passing out, or any other new or worsening symptoms that concern you.  Follow-up with an outpatient psychiatry provider.  We have given you referral information for [facility].

## 2024-07-20 NOTE — ED Provider Notes (Signed)
 [facility] Provider Note    Event Date/Time   First MD Initiated Contact with Patient 07/20/24 1504     (approximate)   History   Abdominal Pain and Suicidal   HPI  Alicia Jacobson is a 33 y.o. female with a history of depression who presents with multiple complaints over approximately the last month.  The patient reports severe lower abdominal pain which is intermittent, worse when she stands up from lying down, mainly over the lower abdomen.  She reports associated nausea and vomiting intermittently, as well as diarrhea which has had intermittent bright red blood.  Most recently she noticed blood 3 to 4 days ago.  She also reports generalized weakness and lightheadedness when standing.  She has also had spots or flashes in her vision intermittently, transient swelling to various extremities, and hair loss after using the lice treatment.  In addition the patient reports worsening depression and suicidal ideation.  She has no plan and has not attempted to harm herself.  She is not currently on any medication for depression.  I reviewed the past medical records.  The patient's most recent prior encounter in our system was in the ED in September 2023 for nausea, vomiting, and abdominal pain.  She had a negative workup at that time.   Physical Exam   Triage Vital Signs: ED Triage Vitals [07/20/24 1343]  Encounter Vitals Group     BP 129/88     Girls Systolic BP Percentile      Girls Diastolic BP Percentile      Boys Systolic BP Percentile      Boys Diastolic BP Percentile      Pulse Rate 82     Resp 17     Temp 98.3 F (36.8 C)     Temp Source Oral     SpO2 98 %     Weight 85 lb (38.6 kg)     Height 5' (1.524 m)     Head Circumference      Peak Flow      Pain Score 2     Pain Loc      Pain Education      Exclude from Growth Chart     Most recent vital signs: Vitals:   07/20/24 1959 07/21/24 0012  BP: 110/69 (!) 120/94  Pulse: 77 80  Resp:  18 16  Temp: 98.5 F (36.9 C) 97.7 F (36.5 C)  SpO2: 100% 100%     General: Alert, very anxious appearing, no distress.  CV:  Good peripheral perfusion.  Resp:  Normal effort.  Abd:  Soft with mild suprapubic tenderness.  No distention.  Other:  Somewhat dry mucous membranes.  Calm and cooperative.   ED Results / Procedures / Treatments   Labs (all labs ordered are listed, but only abnormal results are displayed) Labs Reviewed  COMPREHENSIVE METABOLIC PANEL WITH GFR - Abnormal; Notable for the following components:      Result Value   Sodium 134 (*)    Glucose, Bld 107 (*)    BUN 21 (*)    Calcium 7.4 (*)    Total Protein 5.2 (*)    Albumin 1.5 (*)    All other components within normal limits  URINALYSIS, ROUTINE W REFLEX MICROSCOPIC - Abnormal; Notable for the following components:   Color, Urine AMBER (*)    APPearance CLOUDY (*)    Specific Gravity, Urine 1.035 (*)    Hgb urine dipstick SMALL (*)  Protein, ur >=300 (*)    Nitrite POSITIVE (*)    Bacteria, UA MANY (*)    All other components within normal limits  TSH - Abnormal; Notable for the following components:   TSH 4.513 (*)    All other components within normal limits  LIPASE, BLOOD  CBC  PREGNANCY, URINE  TYPE AND SCREEN     EKG     RADIOLOGY  CT abdomen/pelvis: I independently viewed and interpreted the images; there are no dilated bowel loops or any free air or free fluid.  Radiology report indicates the following:  IMPRESSION:  1. No acute findings in the abdomen/pelvis.  2. Mild-to-moderate free fluid over the pelvis and minimal free  fluid over the pericolic gutters.  3. Small amount of left pleural fluid with minimal associated linear  atelectasis.    PROCEDURES:  Critical Care performed: No  Procedures   MEDICATIONS ORDERED IN ED: Medications  sodium chloride  0.9 % bolus 1,000 mL (0 mLs Intravenous Stopped 07/20/24 1753)  iohexol  (OMNIPAQUE ) 300 MG/ML solution 75 mL (75  mLs Intravenous Contrast Given 07/20/24 1751)  acetaminophen  (TYLENOL ) tablet 650 mg (650 mg Oral Given 07/20/24 2337)  sulfamethoxazole-trimethoprim (BACTRIM DS) 800-160 MG per tablet 1 tablet (1 tablet Oral Given 07/21/24 0011)     IMPRESSION / MDM / ASSESSMENT AND PLAN / ED COURSE  I reviewed the triage vital signs and the nursing notes.  33 year old female with PMH as noted above presents with multiple complaints all of which seem to be subacute, for approximate last month, primarily abdominal pain and GI symptoms as well as depression and SI.  Differential diagnosis includes, but is not limited to:  Abdominal pain, vomiting, diarrhea, GI bleed: Gastritis, gastroenteritis, colitis, diverticulitis, diverticulosis, IBD, IBS.    Other symptoms (visual symptoms, hair loss, intermittent swelling): Possibly anxiety mediated, autoimmune, or metabolic.  We will obtain lab workup.  Mental health symptoms: Major depressive disorder, adjustment disorder, substance-induced mood disorder.  We will obtain psychiatry and TTS consults.  At this time there is no indication for commitment.  Patient's presentation is most consistent with acute presentation with potential threat to life or bodily function.  The patient is on the cardiac monitor to evaluate for evidence of arrhythmia and/or significant heart rate changes.   The patient has been placed in psychiatric observation due to the need to provide a safe environment for the patient while obtaining psychiatric consultation and evaluation, as well as ongoing medical and medication management to treat the patient's condition.  The patient has not been placed under full IVC at this time.   ----------------------------------------- 12:59 AM on 07/21/2024 -----------------------------------------  CT shows no acute abnormalities.  There is some free fluid in the pelvis, but this was seen on prior imaging in 2023.  Urinalysis is consistent with a UTI.   TSH is borderline elevated.  CMP shows significant hypoalbuminemia.  Corrected calcium is normal.  CBC shows no acute findings.  I consulted and discussed the case with the psychiatry provider who evaluated the patient.  She advised that there is no indication for inpatient admission and recommends discharge with outpatient follow-up.  There is no indication for danger to self or others at this time.  I had an extensive discussion with the patient about the plan of care.  I suspect that the suprapubic pain is likely related to the cystitis and I recommend a course of antibiotics.  I have also prescribed a small quantity of pain medication for breakthrough pain not controlled  by Tylenol .  I am concerned about the hypoalbuminemia.  Based on the patient's history, this appears most likely related to malnutrition.  The patient has a history of eating disorder type symptoms and has been treated for poor appetite in the past although has not been given a formal diagnosis.  She denies any bulimic type symptoms.  She states that she never attempted to vomit.  She states that she actually is eating somewhat better than she has been recently although the pain has interfered with this.  She states she does not eat enough protein.  I did consider whether the patient may benefit from inpatient admission, however there is not really a specific treatment that she would obtain in the hospital.  The main thing will be increasing her protein intake and nutrition.  I had an extensive discussion with the patient about this.  She feels comfortable going home.  There is no indication of any acute complication.  It seems that she is tolerating p.o. and he is able to eat although she has not gained weight recently.  I gave the patient strict return precautions and she expressed understanding.  I have provided referral to [facility].  I prescribed Bactrim for the UTI and hydrocodone for breakthrough pain.  The patient is stable for  discharge at this time.  She feels comfortable going home.   FINAL CLINICAL IMPRESSION(S) / ED DIAGNOSES   Final diagnoses:  Suicidal ideation  Hypoalbuminemia  Lower abdominal pain  Urinary tract infection without hematuria, site unspecified     Rx / DC Orders   ED Discharge Orders          Ordered    clindamycin  (CLEOCIN ) 75 MG/5ML solution  3 times daily,   Status:  Discontinued        07/20/24 2342    HYDROcodone-acetaminophen  (HYCET) 7.5-325 mg/15 ml solution  4 times daily PRN        07/20/24 2343    sulfamethoxazole-trimethoprim (BACTRIM) 200-40 MG/5ML suspension  2 times daily        07/20/24 2347             Note:  This document was prepared using Dragon voice recognition software and may include unintentional dictation errors.    Jacolyn Pae, MD 07/21/24 [id number]

## 2024-07-21 NOTE — BH Assessment (Signed)
 Comprehensive Clinical Assessment (CCA) Screening, Triage and Referral Note   07/21/2024 Alicia Jacobson [id number]  Disposition: Per Dr Anastacio, patient is recommended for discharge with outpatient follow-up.   The patient demonstrates the following risk factors for suicide: Chronic risk factors for suicide include: psychiatric disorder of Major Depressive Disorder . Acute risk factors for suicide include: chronic illness . Protective factors for this patient include: positive social support and responsibility to others (children, family). Considering these factors, the overall suicide risk at this point appears to be low. Patient is appropriate for outpatient follow up.   Patient is a 33 year old female with a history of depression who presents voluntarily to [facility] ED for an assessment. Patient resides in the home with husband and 3 children and identifies them  as their primary support system.Patient reports isolation, crying spells, irritability, hopelessness, guilt, loss of interest to do things they enjoy, fatigue, lack of concentration, worthlessness, change in sleep, change in appetite. Patient reports history of past suicide attempts, last occurrence was in 2018.  Patient denies hx of Substance Abuse. Patient denies NSSIB, HI, AVH. Pt reports passive suicidal thoughts. Pt denies intent and plan.   Patient identifies her primary stressors as her health and depression. Patient denies history of abuse or trauma. Patient denies current legal problems. Patient is not receiving outpatient therapy and psychiatry services. Patient reports she  does not take  medications. Patient denies previous inpatient admission. Patient denies access to weapons.   Patient is able to to contract for safety outside of the hospital.  Patient gives verbal consent for [facility] to speak with husband who was present during the assessment. Per spouse patient is struggling with depression. Spouse reports that he is able to  monitor pt's safety and has no concerns for pt's safety at this time.   Treatment options were discussed and patient is in agreement with recommendation for discharge .   During evaluation pt is in no acute distress. She is alert, oriented x 4, calm, cooperative and attentive. her mood is anxious with congruent affect. She has normal speech, and behavior.  Objectively there is no evidence of psychosis/mania or delusional thinking.  Patient is able to converse coherently, goal directed thoughts, no distractibility, or pre-occupation.   She also denies suicidal/self-harm/homicidal ideation, psychosis, and paranoia.  Patient answered question appropriately.       Chief Complaint:  Chief Complaint  Patient presents with   Abdominal Pain   Suicidal   Visit Diagnosis: Major Depression Disorder   Patient Reported Information How did you hear about us ? -- [facility] ED)  What Is the Reason for Your Visit/Call Today? Per EDP's note : Pt is a 33 y.o. female with a history of depression who presents with multiple complaints over approximately the last month.  The patient reports severe lower abdominal pain which is intermittent, worse when she stands up from lying down, mainly over the lower abdomen.  She reports associated nausea and vomiting intermittently, as well as diarrhea which has had intermittent bright red blood.  Most recently she noticed blood 3 to 4 days ago.  She also reports generalized weakness and lightheadedness when standing.  She has also had spots or flashes in her vision intermittently, transient swelling to various extremities, and hair loss after using the lice treatment.  In addition the patient reports worsening depression and suicidal ideation.  She has no plan and has not attempted to harm herself.  She is not currently on any medication for depression.  I reviewed the past medical records.  The patient's most recent prior encounter in our system was in the ED in September 2023  for nausea, vomiting, and abdominal pain.  She had a negative workup at that time.    How Long Has This Been Causing You Problems? > than 6 months  What Do You Feel Would Help You the Most Today? Treatment for Depression or other mood problem; Stress Management; Medication(s)   Have You Recently Had Any Thoughts About Hurting Yourself? Yes  Are You Planning to Commit Suicide/Harm Yourself At This time? No   Have you Recently Had Thoughts About Hurting Someone [place]? No  Are You Planning to Harm Someone at This Time? No  Explanation: Denies HI   Have You Used Any Alcohol or Drugs in the Past 24 Hours? No  How Long Ago Did You Use Drugs or Alcohol? N/A What Did You Use and How Much? N/A  Do You Currently Have a Therapist/Psychiatrist? No  Name of Therapist/Psychiatrist: n/a  Have You Been Recently Discharged From Any Office Practice or Programs? No  Explanation of Discharge From Practice/Program: n/a   CCA Screening Triage Referral Assessment Type of Contact: Face-to-Face  Telemedicine Service Delivery:   Is this Initial or Reassessment?   Date Telepsych consult ordered in [facility]:    Time Telepsych consult ordered in [facility]:    Location of Assessment: [facility] ED  Provider Location: [facility] ED    Collateral Involvement: Husband:   Candance, Bohlman (Spouse)  [phone] (Mobile)   Does Patient Have a Automotive engineer Guardian? No data recorded Name and Contact of Legal Guardian: No data recorded If Minor and Not Living with Parent(s), Who has Custody? n/a  Is CPS involved or ever been involved? Never  Is APS involved or ever been involved? Never   Patient Determined To Be At Risk for Harm To Self or Others Based on Review of Patient Reported Information or Presenting Complaint? Yes, for Self-Harm  Method: No Plan  Availability of Means: No access or NA  Intent: Vague intent or NA  Notification Required: No need or identified person  Additional Information for  Danger to Others Potential: -- (n/a)  Additional Comments for Danger to Others Potential: Denies HI  Are There Guns or Other Weapons in Your Home? Yes  Types of Guns/Weapons: Pt reports husband has guns in the home  Are These Weapons Safely Secured?                            Yes  Who Could Verify You Are Able To Have These Secured: Per husband, pt has no access to guns. Guns are locked up in a locked safe.  Do You Have any Outstanding Charges, Pending Court Dates, Parole/Probation? Denies pending legal charges  Contacted To Inform of Risk of Harm To Self or Others: Other: Comment (n/a)   Does Patient Present under Involuntary Commitment? No    [place] of Residence: [place]   Patient Currently Receiving the Following Services: Not Receiving Services   Determination of Need: Routine (7 days)   Options For Referral: Medication Management; Outpatient Therapy   Disposition Recommendation per psychiatric provider: There are no psychiatric contraindications to discharge at this time  Rosina PARAS, [place], [facility]
